# Patient Record
Sex: Female | Born: 1980 | Race: White | Hispanic: No
Health system: Southern US, Community
[De-identification: ages and names within clinical notes are randomized; demographics above are authoritative.]

---

## 2019-11-15 DIAGNOSIS — I361 Nonrheumatic tricuspid (valve) insufficiency: Secondary | ICD-10-CM | POA: Diagnosis not present

## 2019-11-16 ENCOUNTER — Inpatient Hospital Stay (HOSPITAL_COMMUNITY): Payer: Medicaid Other

## 2019-11-16 ENCOUNTER — Inpatient Hospital Stay (HOSPITAL_COMMUNITY)
Admission: AD | Admit: 2019-11-16 | Discharge: 2019-12-11 | DRG: 870 | Disposition: E | Payer: Medicaid Other | Source: Other Acute Inpatient Hospital | Attending: Pulmonary Disease | Admitting: Pulmonary Disease

## 2019-11-16 DIAGNOSIS — R6521 Severe sepsis with septic shock: Secondary | ICD-10-CM | POA: Diagnosis present

## 2019-11-16 DIAGNOSIS — N179 Acute kidney failure, unspecified: Secondary | ICD-10-CM | POA: Diagnosis present

## 2019-11-16 DIAGNOSIS — A419 Sepsis, unspecified organism: Secondary | ICD-10-CM | POA: Diagnosis not present

## 2019-11-16 DIAGNOSIS — R7881 Bacteremia: Secondary | ICD-10-CM | POA: Diagnosis not present

## 2019-11-16 DIAGNOSIS — E872 Acidosis: Secondary | ICD-10-CM | POA: Diagnosis not present

## 2019-11-16 DIAGNOSIS — Z9911 Dependence on respirator [ventilator] status: Secondary | ICD-10-CM

## 2019-11-16 DIAGNOSIS — J9602 Acute respiratory failure with hypercapnia: Secondary | ICD-10-CM | POA: Diagnosis present

## 2019-11-16 DIAGNOSIS — Z9884 Bariatric surgery status: Secondary | ICD-10-CM | POA: Diagnosis not present

## 2019-11-16 DIAGNOSIS — E875 Hyperkalemia: Secondary | ICD-10-CM | POA: Diagnosis present

## 2019-11-16 DIAGNOSIS — Z978 Presence of other specified devices: Secondary | ICD-10-CM

## 2019-11-16 DIAGNOSIS — Z791 Long term (current) use of non-steroidal anti-inflammatories (NSAID): Secondary | ICD-10-CM

## 2019-11-16 DIAGNOSIS — F1190 Opioid use, unspecified, uncomplicated: Secondary | ICD-10-CM | POA: Diagnosis present

## 2019-11-16 DIAGNOSIS — D6959 Other secondary thrombocytopenia: Secondary | ICD-10-CM | POA: Diagnosis present

## 2019-11-16 DIAGNOSIS — L8990 Pressure ulcer of unspecified site, unspecified stage: Secondary | ICD-10-CM | POA: Diagnosis present

## 2019-11-16 DIAGNOSIS — R0602 Shortness of breath: Secondary | ICD-10-CM | POA: Diagnosis present

## 2019-11-16 DIAGNOSIS — G43909 Migraine, unspecified, not intractable, without status migrainosus: Secondary | ICD-10-CM | POA: Diagnosis not present

## 2019-11-16 DIAGNOSIS — Z515 Encounter for palliative care: Secondary | ICD-10-CM | POA: Diagnosis not present

## 2019-11-16 DIAGNOSIS — N170 Acute kidney failure with tubular necrosis: Secondary | ICD-10-CM | POA: Diagnosis not present

## 2019-11-16 DIAGNOSIS — R34 Anuria and oliguria: Secondary | ICD-10-CM | POA: Diagnosis not present

## 2019-11-16 DIAGNOSIS — R0902 Hypoxemia: Secondary | ICD-10-CM

## 2019-11-16 DIAGNOSIS — Z66 Do not resuscitate: Secondary | ICD-10-CM | POA: Diagnosis not present

## 2019-11-16 DIAGNOSIS — J13 Pneumonia due to Streptococcus pneumoniae: Secondary | ICD-10-CM | POA: Diagnosis present

## 2019-11-16 DIAGNOSIS — Z87891 Personal history of nicotine dependence: Secondary | ICD-10-CM

## 2019-11-16 DIAGNOSIS — L89151 Pressure ulcer of sacral region, stage 1: Secondary | ICD-10-CM | POA: Diagnosis present

## 2019-11-16 DIAGNOSIS — F319 Bipolar disorder, unspecified: Secondary | ICD-10-CM | POA: Diagnosis present

## 2019-11-16 DIAGNOSIS — D6489 Other specified anemias: Secondary | ICD-10-CM | POA: Diagnosis present

## 2019-11-16 DIAGNOSIS — E8729 Other acidosis: Secondary | ICD-10-CM | POA: Diagnosis present

## 2019-11-16 DIAGNOSIS — E876 Hypokalemia: Secondary | ICD-10-CM | POA: Diagnosis not present

## 2019-11-16 DIAGNOSIS — J80 Acute respiratory distress syndrome: Secondary | ICD-10-CM

## 2019-11-16 DIAGNOSIS — E874 Mixed disorder of acid-base balance: Secondary | ICD-10-CM | POA: Diagnosis not present

## 2019-11-16 DIAGNOSIS — Z91018 Allergy to other foods: Secondary | ICD-10-CM

## 2019-11-16 DIAGNOSIS — Z09 Encounter for follow-up examination after completed treatment for conditions other than malignant neoplasm: Secondary | ICD-10-CM

## 2019-11-16 DIAGNOSIS — Z91013 Allergy to seafood: Secondary | ICD-10-CM | POA: Diagnosis not present

## 2019-11-16 DIAGNOSIS — A403 Sepsis due to Streptococcus pneumoniae: Principal | ICD-10-CM | POA: Diagnosis present

## 2019-11-16 LAB — MRSA PCR SCREENING: MRSA by PCR: NEGATIVE

## 2019-11-16 LAB — POCT I-STAT 7, (LYTES, BLD GAS, ICA,H+H)
Acid-base deficit: 12 mmol/L — ABNORMAL HIGH (ref 0.0–2.0)
Acid-base deficit: 12 mmol/L — ABNORMAL HIGH (ref 0.0–2.0)
Acid-base deficit: 10 mmol/L — ABNORMAL HIGH (ref 0.0–2.0)
Bicarbonate: 18.4 mmol/L — ABNORMAL LOW (ref 20.0–28.0)
Bicarbonate: 19.2 mmol/L — ABNORMAL LOW (ref 20.0–28.0)
Bicarbonate: 19.9 mmol/L — ABNORMAL LOW (ref 20.0–28.0)
Calcium, Ion: 1.19 mmol/L (ref 1.15–1.40)
Calcium, Ion: 1.2 mmol/L (ref 1.15–1.40)
Calcium, Ion: 1.16 mmol/L (ref 1.15–1.40)
HCT: 36 % (ref 36.0–46.0)
HCT: 38 % (ref 36.0–46.0)
HCT: 38 % (ref 36.0–46.0)
Hemoglobin: 12.2 g/dL (ref 12.0–15.0)
Hemoglobin: 12.9 g/dL (ref 12.0–15.0)
Hemoglobin: 12.9 g/dL (ref 12.0–15.0)
O2 Saturation: 92 %
O2 Saturation: 94 %
O2 Saturation: 93 %
Patient temperature: 98.9
Patient temperature: 98.9
Patient temperature: 99.9
Potassium: 4 mmol/L (ref 3.5–5.1)
Potassium: 4.3 mmol/L (ref 3.5–5.1)
Potassium: 4 mmol/L (ref 3.5–5.1)
Sodium: 138 mmol/L (ref 135–145)
Sodium: 140 mmol/L (ref 135–145)
Sodium: 139 mmol/L (ref 135–145)
TCO2: 20 mmol/L — ABNORMAL LOW (ref 22–32)
TCO2: 21 mmol/L — ABNORMAL LOW (ref 22–32)
TCO2: 22 mmol/L (ref 22–32)
pCO2 arterial: 66.1 mmHg (ref 32.0–48.0)
pCO2 arterial: 67.1 mmHg (ref 32.0–48.0)
pCO2 arterial: 64.5 mmHg — ABNORMAL HIGH (ref 32.0–48.0)
pH, Arterial: 7.054 — CL (ref 7.350–7.450)
pH, Arterial: 7.065 — CL (ref 7.350–7.450)
pH, Arterial: 7.102 — CL (ref 7.350–7.450)
pO2, Arterial: 93 mmHg (ref 83.0–108.0)
pO2, Arterial: 101 mmHg (ref 83.0–108.0)
pO2, Arterial: 97 mmHg (ref 83.0–108.0)

## 2019-11-16 LAB — CBC WITH DIFFERENTIAL/PLATELET
Abs Immature Granulocytes: 0.91 10*3/uL — ABNORMAL HIGH (ref 0.00–0.07)
Basophils Absolute: 0 10*3/uL (ref 0.0–0.1)
Basophils Relative: 0 %
Eosinophils Absolute: 0 10*3/uL (ref 0.0–0.5)
Eosinophils Relative: 0 %
HCT: 37.4 % (ref 36.0–46.0)
Hemoglobin: 11.8 g/dL — ABNORMAL LOW (ref 12.0–15.0)
Immature Granulocytes: 5 %
Lymphocytes Relative: 3 %
Lymphs Abs: 0.5 10*3/uL — ABNORMAL LOW (ref 0.7–4.0)
MCH: 28.4 pg (ref 26.0–34.0)
MCHC: 31.6 g/dL (ref 30.0–36.0)
MCV: 89.9 fL (ref 80.0–100.0)
Monocytes Absolute: 0.5 10*3/uL (ref 0.1–1.0)
Monocytes Relative: 3 %
Neutro Abs: 16.2 10*3/uL — ABNORMAL HIGH (ref 1.7–7.7)
Neutrophils Relative %: 89 %
Platelets: 99 10*3/uL — ABNORMAL LOW (ref 150–400)
RBC: 4.16 MIL/uL (ref 3.87–5.11)
RDW: 17.2 % — ABNORMAL HIGH (ref 11.5–15.5)
WBC: 18.2 10*3/uL — ABNORMAL HIGH (ref 4.0–10.5)
nRBC: 0.2 % (ref 0.0–0.2)

## 2019-11-16 LAB — HIV ANTIBODY (ROUTINE TESTING W REFLEX): HIV Screen 4th Generation wRfx: NONREACTIVE

## 2019-11-16 LAB — BASIC METABOLIC PANEL
Anion gap: 11 (ref 5–15)
BUN: 87 mg/dL — ABNORMAL HIGH (ref 6–20)
CO2: 19 mmol/L — ABNORMAL LOW (ref 22–32)
Calcium: 8 mg/dL — ABNORMAL LOW (ref 8.9–10.3)
Chloride: 107 mmol/L (ref 98–111)
Creatinine, Ser: 4.04 mg/dL — ABNORMAL HIGH (ref 0.44–1.00)
GFR calc Af Amer: 15 mL/min — ABNORMAL LOW (ref >60)
GFR calc non Af Amer: 13 mL/min — ABNORMAL LOW (ref >60)
Glucose, Bld: 127 mg/dL — ABNORMAL HIGH (ref 70–99)
Potassium: 4 mmol/L (ref 3.5–5.1)
Sodium: 137 mmol/L (ref 135–145)

## 2019-11-16 LAB — GLUCOSE, CAPILLARY
Glucose-Capillary: 113 mg/dL — ABNORMAL HIGH (ref 70–99)
Glucose-Capillary: 114 mg/dL — ABNORMAL HIGH (ref 70–99)

## 2019-11-16 LAB — HEPATIC FUNCTION PANEL
ALT: 53 U/L — ABNORMAL HIGH (ref 0–44)
AST: 75 U/L — ABNORMAL HIGH (ref 15–41)
Albumin: 1.9 g/dL — ABNORMAL LOW (ref 3.5–5.0)
Alkaline Phosphatase: 69 U/L (ref 38–126)
Bilirubin, Direct: 0.5 mg/dL — ABNORMAL HIGH (ref 0.0–0.2)
Indirect Bilirubin: 0.8 mg/dL (ref 0.3–0.9)
Total Bilirubin: 1.3 mg/dL — ABNORMAL HIGH (ref 0.3–1.2)
Total Protein: 5.2 g/dL — ABNORMAL LOW (ref 6.5–8.1)

## 2019-11-16 LAB — MAGNESIUM: Magnesium: 2.5 mg/dL — ABNORMAL HIGH (ref 1.7–2.4)

## 2019-11-16 LAB — PROTIME-INR
INR: 1.3 — ABNORMAL HIGH (ref 0.8–1.2)
Prothrombin Time: 15.9 seconds — ABNORMAL HIGH (ref 11.4–15.2)

## 2019-11-16 LAB — HEMOGLOBIN A1C
Hgb A1c MFr Bld: 5.7 % — ABNORMAL HIGH (ref 4.8–5.6)
Mean Plasma Glucose: 116.89 mg/dL

## 2019-11-16 LAB — TRIGLYCERIDES: Triglycerides: 194 mg/dL — ABNORMAL HIGH (ref <150)

## 2019-11-16 LAB — LACTIC ACID, PLASMA: Lactic Acid, Venous: 1.1 mmol/L (ref 0.5–1.9)

## 2019-11-16 MED ORDER — CHLORHEXIDINE GLUCONATE 0.12% ORAL RINSE (MEDLINE KIT)
15.0000 mL | Freq: Two times a day (BID) | OROMUCOSAL | Status: DC
  Administered 2019-11-16 – 2019-11-23 (×15): 15 mL via OROMUCOSAL

## 2019-11-16 MED ORDER — SODIUM BICARBONATE 8.4 % IV SOLN
100.0000 meq | Freq: Once | INTRAVENOUS | Status: AC
  Administered 2019-11-16: 100 meq via INTRAVENOUS
  Filled 2019-11-16: qty 100

## 2019-11-16 MED ORDER — NOREPINEPHRINE 4 MG/250ML-% IV SOLN
0.0000 ug/min | INTRAVENOUS | Status: DC
  Administered 2019-11-16: 5 ug/min via INTRAVENOUS
  Filled 2019-11-16: qty 250

## 2019-11-16 MED ORDER — SODIUM CHLORIDE 0.9 % IV SOLN: INTRAVENOUS | Status: DC | PRN

## 2019-11-16 MED ORDER — HEPARIN SODIUM (PORCINE) 5000 UNIT/ML IJ SOLN
5000.0000 [IU] | Freq: Three times a day (TID) | INTRAMUSCULAR | Status: DC
  Administered 2019-11-16 – 2019-11-23 (×21): 5000 [IU] via SUBCUTANEOUS
  Filled 2019-11-16 (×19): qty 1

## 2019-11-16 MED ORDER — ORAL CARE MOUTH RINSE
15.0000 mL | OROMUCOSAL | Status: DC
  Administered 2019-11-16 – 2019-11-23 (×68): 15 mL via OROMUCOSAL

## 2019-11-16 MED ORDER — INSULIN ASPART 100 UNIT/ML ~~LOC~~ SOLN
0.0000 [IU] | SUBCUTANEOUS | Status: DC
  Administered 2019-11-17 – 2019-11-18 (×4): 1 [IU] via SUBCUTANEOUS
  Administered 2019-11-18: 2 [IU] via SUBCUTANEOUS
  Administered 2019-11-18: 1 [IU] via SUBCUTANEOUS
  Administered 2019-11-18: 2 [IU] via SUBCUTANEOUS
  Administered 2019-11-19 – 2019-11-20 (×4): 1 [IU] via SUBCUTANEOUS
  Administered 2019-11-20: 2 [IU] via SUBCUTANEOUS
  Administered 2019-11-20 (×3): 1 [IU] via SUBCUTANEOUS
  Administered 2019-11-20: 2 [IU] via SUBCUTANEOUS
  Administered 2019-11-21 – 2019-11-23 (×8): 1 [IU] via SUBCUTANEOUS
  Administered 2019-11-23: 2 [IU] via SUBCUTANEOUS
  Administered 2019-11-23: 1 [IU] via SUBCUTANEOUS
  Administered 2019-11-23: 2 [IU] via SUBCUTANEOUS

## 2019-11-16 MED ORDER — PROPOFOL 1000 MG/100ML IV EMUL
5.0000 ug/kg/min | INTRAVENOUS | Status: DC
  Administered 2019-11-16: 50 ug/kg/min via INTRAVENOUS
  Administered 2019-11-16: 25 ug/kg/min via INTRAVENOUS
  Administered 2019-11-17 (×5): 50 ug/kg/min via INTRAVENOUS
  Administered 2019-11-17: 40 ug/kg/min via INTRAVENOUS
  Administered 2019-11-18: 80 ug/kg/min via INTRAVENOUS
  Filled 2019-11-16 (×13): qty 100

## 2019-11-16 MED ORDER — NOREPINEPHRINE 16 MG/250ML-% IV SOLN
0.0000 ug/min | INTRAVENOUS | Status: DC
  Administered 2019-11-16: 5 ug/min via INTRAVENOUS
  Administered 2019-11-17: 11 ug/min via INTRAVENOUS
  Administered 2019-11-18: 10 ug/min via INTRAVENOUS
  Administered 2019-11-20: 4 ug/min via INTRAVENOUS
  Administered 2019-11-22: 13 ug/min via INTRAVENOUS
  Administered 2019-11-23: 20 ug/min via INTRAVENOUS
  Filled 2019-11-16 (×6): qty 250

## 2019-11-16 MED ORDER — VECURONIUM BOLUS VIA INFUSION
10.0000 mg | Freq: Once | INTRAVENOUS | Status: DC
  Filled 2019-11-16: qty 10

## 2019-11-16 MED ORDER — FENTANYL 2500MCG IN NS 250ML (10MCG/ML) PREMIX INFUSION
0.0000 ug/h | INTRAVENOUS | Status: DC
  Administered 2019-11-16 – 2019-11-23 (×15): 200 ug/h via INTRAVENOUS
  Filled 2019-11-16 (×16): qty 250

## 2019-11-16 MED ORDER — VECURONIUM BROMIDE 10 MG IV SOLR
10.0000 mg | Freq: Once | INTRAVENOUS | Status: AC
  Administered 2019-11-16: 10 mg via INTRAVENOUS
  Filled 2019-11-16: qty 10

## 2019-11-16 MED ORDER — LACTATED RINGERS IV BOLUS (SEPSIS)
1000.0000 mL | Freq: Once | INTRAVENOUS | Status: AC
  Administered 2019-11-16: 1000 mL via INTRAVENOUS

## 2019-11-16 MED ORDER — HYDROCORTISONE NA SUCCINATE PF 100 MG IJ SOLR
50.0000 mg | Freq: Four times a day (QID) | INTRAMUSCULAR | Status: DC
  Administered 2019-11-16 – 2019-11-18 (×7): 50 mg via INTRAVENOUS
  Filled 2019-11-16 (×7): qty 2

## 2019-11-16 MED ORDER — SODIUM CHLORIDE 0.9 % IV SOLN: INTRAVENOUS | Status: DC

## 2019-11-16 MED ORDER — VANCOMYCIN HCL IN DEXTROSE 1-5 GM/200ML-% IV SOLN
1000.0000 mg | INTRAVENOUS | Status: DC
  Administered 2019-11-16: 1000 mg via INTRAVENOUS
  Filled 2019-11-16: qty 200

## 2019-11-16 MED ORDER — SODIUM CHLORIDE 0.9 % IV SOLN
1.0000 g | Freq: Two times a day (BID) | INTRAVENOUS | Status: DC
  Administered 2019-11-16: 1 g via INTRAVENOUS
  Filled 2019-11-16 (×3): qty 1

## 2019-11-16 NOTE — Progress Notes (Signed)
ABG obtained after patient arrived from [place] via [employer].  Sample obtained on settings of VT: 540, RR: 15, PEEP: 8, FIO2: 80%.  Results given to MD.  Instructed to place patient on 6cc/kg VT which is 410 and increase RR to 25.  Currently patient getting lines placed.  Will obtain follow up sample once MD finished.  Will continue to monitor.    Ref. Range 29-Nov-2019 17:06  Sample type Unknown ARTERIAL  pH, Arterial Latest Ref Range: 7.35 - 7.45  7.054 (LL)  pCO2 arterial Latest Ref Range: 32 - 48 mmHg 66.1 (HH)  pO2, Arterial Latest Ref Range: 83 - 108 mmHg 93  TCO2 Latest Ref Range: 22 - 32 mmol/L 20 (L)  Acid-base deficit Latest Ref Range: 0.0 - 2.0 mmol/L 12.0 (H)  Bicarbonate Latest Ref Range: 20.0 - 28.0 mmol/L 18.4 (L)  O2 Saturation Latest Units: % 92.0  Patient temperature Unknown 98.9 F  Collection site Unknown Radial

## 2019-11-16 NOTE — Procedures (Signed)
Central Venous Catheter Insertion Procedure Note  Gala Padovano  [id number]  July 17, 1980  Date:2019/11/27  Time:6:52 PM   Provider Performing:Delayni Streed   Procedure: Insertion of Non-tunneled Central Venous [phone]) with US guidance (62831)   Indication(s) Medication administration  Consent Risks of the procedure as well as the alternatives and risks of each were explained to the patient and/or caregiver.  Consent for the procedure was obtained and is signed in the bedside chart  Anesthesia Topical only with 1% lidocaine   Timeout Verified patient identification, verified procedure, site/side was marked, verified correct patient position, special equipment/implants available, medications/allergies/relevant history reviewed, required imaging and test results available.  Sterile Technique Maximal sterile technique including full sterile barrier drape, hand hygiene, sterile gown, sterile gloves, mask, hair covering, sterile ultrasound probe cover (if used).  Procedure Description Area of catheter insertion was cleaned with chlorhexidine and draped in sterile fashion.  With real-time ultrasound guidance a central venous catheter was placed into the right internal jugular vein. Nonpulsatile blood flow and easy flushing noted in all ports.  The catheter was sutured in place and sterile dressing applied.  Complications/Tolerance None; patient tolerated the procedure well. Chest X-ray is ordered to verify placement for internal jugular or subclavian cannulation.   Chest x-ray is not ordered for femoral cannulation.  EBL Minimal  Specimen(s) None

## 2019-11-16 NOTE — Progress Notes (Signed)
CRITICAL VALUE ALERT  Critical Value:  ABGs   Date & Time Notied:  11/16/18 2334  Provider Notified: E-link RN  Orders Received/Actions taken: RN will notify MD. Waiting for orders

## 2019-11-16 NOTE — Progress Notes (Signed)
eLink Physician-Brief Progress Note Patient Name: Rachael Harvey DOB: Feb 13, 1981 MRN: [id number]   Date of Service  Nov 18, 2019  HPI/Events of Note  Patient needs an ABG stat and a.m. Troponin  eICU Interventions  Orders entered.        Rachael Harvey 11-18-2019, 11:12 PM

## 2019-11-16 NOTE — H&P (Signed)
NAME:  Rachael Harvey, MRN:  [id number], DOB:  02-21-1981, LOS: 0 ADMISSION DATE:  2019/11/28,   CHIEF COMPLAINT:  Shortness of breath  Brief History   39 year old female was transferred from outside hospital with acute hypoxic/hypercapnic respiratory failure, ARDS and acute kidney injury  History of present illness   39 year old female with history of anxiety and depression who was transferred from [facility] for worsening respiratory failure. Patient is intubated so most of the history is taken from patient's chart, as per chart patient was not feeling well for last few days with a cough, shortness of breath and fever.  When EMS was called patient was noted to be hypoxic into the 60s, she was put on high flow and was transferred to [facility] emergency department, where she was put on BiPAP but she continued to remain hypoxic so decision was made to intubate, she was admitted in the hospital with acute hypoxic respiratory failure due to multifocal pneumonia. Over the last 2 days she has been getting IV antibiotics, but her condition continued to deteriorate with hypoxia, hypercapnia and acute metabolic acidosis associated with acute kidney injury so she was transferred to [facility] for further management.  Past Medical History  Anxiety Depression Migraine Questionable history of acute leukemia per patient's mother  Significant Hospital Events     Consults:  None  Procedures:  CVL 7/7 >> Arterial line >>  Significant Diagnostic Tests:    Micro Data:  7/5 Blood culture strep pneumo 7/7 >>  Antimicrobials:  7/7 Vancomycin >> 7/7 Cefepime>>  Interim history/subjective:  Patient's blood gas came back with a pH of 7.0 PCO2 in the 60s with PO2 93 on 80% FiO2.  Her P to F ratio is less than 150  Objective   Blood pressure (!) 103/53, pulse 100, resp. rate 12, height 5\' 10"  (1.778 m), weight 97 kg, SpO2 93 %.    Vent Mode: PRVC FiO2 (%):  [80 %] 80 % Set Rate:   [15 bmp] 15 bmp Vt Set:  [540 mL] 540 mL PEEP:  [8 cmH20] 8 cmH20  No intake or output data in the 24 hours ending 11-28-2019 1740 Filed Weights   28-Nov-2019 1608  Weight: 97 kg    Examination: General: Young female looks acutely ill, orally intubated on mechanical ventilation HENT: Mucous membranes are moist, atraumatic, normocephalic Lungs: Bilateral crackles are over Cardiovascular: Regular rate and rhythm, no murmur appreciated Abdomen: Soft, nontender, nondistended, bowel sounds present Extremities: Moves all spontaneously Neuro: Sedated, eyes closed, not following commands Skin: No rash  Resolved Hospital Problem list     Assessment & Plan:  Acute hypoxic/hypercapnic respiratory failure due to multifocal pneumonia Severe ARDS P:  Patient is intubated, on mechanical ventilation Continue high PEEP strategy for arts net protocol Her P to F ratio less than 150 I adjusted her ventilatory setting to improve hypoxemia Repeat ABGs Continue IV antibiotics with cefepime and vancomycin Follow-up blood cultures  Septic shock with endorgan damage due to pneumococcal pneumonia and pneumococcal bacteremia P: Patient blood cultures were positive for pneumococcal pneumonia at outside hospital She has endorgan damage with AKI and respiratory failure I Started Levophed to maintain perfusion to vital organs with map goal of 65 Continue antibiotics with cefepime and vancomycin, to treat the resistant strain of pneumococcus  Acute kidney injury P: Patient presented with serum creatinine of 2, with low urine output likely due to septic shock She received 1 L IV fluid We will monitor serum creatinine closely, if her  urine output did not pick up and her electrolyte started getting the range, will ask renal consult for possible hemodialysis  Mixed acute respiratory/metabolic acidosis P: I adjusted the ventilatory setting with increasing respiratory rate to help clear respiratory acidosis We  will repeat ABGs  Leukopenia/thrombocytopenia P: Likely related to sepsis and septic shock, will continue to monitor   Best practice:  Diet: NPO Pain/Anxiety/Delirium protocol (if indicated): N/A VAP protocol (if indicated): Y DVT prophylaxis: Subcu heparin GI prophylaxis: Famotidine Glucose control: Sliding scale insulin Mobility: Bedrest Code Status: Full code Family Communication: Updated patient's mother who is a healthcare proxy Disposition: ICU  Labs   CBC: Recent Labs  Lab November 30, 2019 1706  HGB 12.2  HCT 36.0    Basic Metabolic Panel: Recent Labs  Lab 30-Nov-2019 1706  NA 138  K 4.0   GFR: CrCl cannot be calculated (No successful lab value found.). No results for input(s): PROCALCITON, WBC, LATICACIDVEN in the last 168 hours.  Liver Function Tests: No results for input(s): AST, ALT, ALKPHOS, BILITOT, PROT, ALBUMIN in the last 168 hours. No results for input(s): LIPASE, AMYLASE in the last 168 hours. No results for input(s): AMMONIA in the last 168 hours.  ABG    Component Value Date/Time   PHART 7.054 (LL) 2019-11-30 1706   PCO2ART 66.1 (HH) 2019-11-30 1706   PO2ART 93 2019/11/30 1706   HCO3 18.4 (L) Nov 30, 2019 1706   TCO2 20 (L) November 30, 2019 1706   ACIDBASEDEF 12.0 (H) 11/30/2019 1706   O2SAT 92.0 November 30, 2019 1706     Coagulation Profile: No results for input(s): INR, PROTIME in the last 168 hours.  Cardiac Enzymes: No results for input(s): CKTOTAL, CKMB, CKMBINDEX, TROPONINI in the last 168 hours.  HbA1C: No results found for: HGBA1C  CBG: No results for input(s): GLUCAP in the last 168 hours.  Review of Systems:   Unable to obtain as patient is intubated and sedated  Past Medical History  As above  Surgical History   Hysterectomy  Gastric bypass surgery  Social History     Ex-smoker Family History   Her family history is not on file.   Allergies Peach leading to hives Shellfish leading to hives  Home Medications  Prior to  Admission medications   Not on File     Critical care time: 41    Performed by: Cheri Fowler   Critical care time was exclusive of separately billable procedures and treating other patients.   Critical care was necessary to treat or prevent imminent or life-threatening deterioration.   Critical care was time spent personally by me on the following activities: development of treatment plan with patient and/or surrogate as well as nursing, discussions with consultants, evaluation of patient's response to treatment, examination of patient, obtaining history from patient or surrogate, ordering and performing treatments and interventions, ordering and review of laboratory studies, ordering and review of radiographic studies, pulse oximetry and re-evaluation of patient's condition.   Cheri Fowler MD Critical care physician [facility]  Pager: [phone] Mobile: [phone]

## 2019-11-16 NOTE — Procedures (Signed)
Central Venous Catheter Insertion Procedure Note  Rachael Harvey  [id number]  12-14-1980  Date:12/11/19  Time:6:51 PM    Provider Performing: Cheri Fowler    Procedure: Insertion of Arterial Line (35573) with US guidance (22025)   Indication(s) Blood pressure monitoring and/or need for frequent ABGs  Consent Risks of the procedure as well as the alternatives and risks of each were explained to the patient and/or caregiver.  Consent for the procedure was obtained and is signed in the bedside chart  Anesthesia None   Time Out Verified patient identification, verified procedure, site/side was marked, verified correct patient position, special equipment/implants available, medications/allergies/relevant history reviewed, required imaging and test results available.   Sterile Technique Maximal sterile technique including full sterile barrier drape, hand hygiene, sterile gown, sterile gloves, mask, hair covering, sterile ultrasound probe cover (if used).   Procedure Description Area of catheter insertion was cleaned with chlorhexidine and draped in sterile fashion. With real-time ultrasound guidance an arterial catheter was placed into the right Axillary artery.  Appropriate arterial tracings confirmed on monitor.     Complications/Tolerance None; patient tolerated the procedure well.   EBL Minimal   Specimen(s) None

## 2019-11-17 DIAGNOSIS — Z9911 Dependence on respirator [ventilator] status: Secondary | ICD-10-CM

## 2019-11-17 DIAGNOSIS — B953 Streptococcus pneumoniae as the cause of diseases classified elsewhere: Secondary | ICD-10-CM | POA: Diagnosis present

## 2019-11-17 DIAGNOSIS — J9602 Acute respiratory failure with hypercapnia: Secondary | ICD-10-CM | POA: Diagnosis present

## 2019-11-17 DIAGNOSIS — E8729 Other acidosis: Secondary | ICD-10-CM | POA: Diagnosis present

## 2019-11-17 LAB — GLUCOSE, CAPILLARY
Glucose-Capillary: 155 mg/dL — ABNORMAL HIGH (ref 70–99)
Glucose-Capillary: 116 mg/dL — ABNORMAL HIGH (ref 70–99)
Glucose-Capillary: 113 mg/dL — ABNORMAL HIGH (ref 70–99)
Glucose-Capillary: 118 mg/dL — ABNORMAL HIGH (ref 70–99)
Glucose-Capillary: 128 mg/dL — ABNORMAL HIGH (ref 70–99)
Glucose-Capillary: 146 mg/dL — ABNORMAL HIGH (ref 70–99)
Glucose-Capillary: 169 mg/dL — ABNORMAL HIGH (ref 70–99)

## 2019-11-17 LAB — CORTISOL-AM, BLOOD: Cortisol - AM: 75.1 ug/dL — ABNORMAL HIGH (ref 6.7–22.6)

## 2019-11-17 LAB — POCT I-STAT 7, (LYTES, BLD GAS, ICA,H+H)
Acid-base deficit: 9 mmol/L — ABNORMAL HIGH (ref 0.0–2.0)
Acid-base deficit: 8 mmol/L — ABNORMAL HIGH (ref 0.0–2.0)
Bicarbonate: 19.1 mmol/L — ABNORMAL LOW (ref 20.0–28.0)
Bicarbonate: 19 mmol/L — ABNORMAL LOW (ref 20.0–28.0)
Calcium, Ion: 1.14 mmol/L — ABNORMAL LOW (ref 1.15–1.40)
Calcium, Ion: 1.14 mmol/L — ABNORMAL LOW (ref 1.15–1.40)
HCT: 36 % (ref 36.0–46.0)
HCT: 34 % — ABNORMAL LOW (ref 36.0–46.0)
Hemoglobin: 12.2 g/dL (ref 12.0–15.0)
Hemoglobin: 11.6 g/dL — ABNORMAL LOW (ref 12.0–15.0)
O2 Saturation: 99 %
O2 Saturation: 99 %
Patient temperature: 38.1
Patient temperature: 38.1
Potassium: 4 mmol/L (ref 3.5–5.1)
Potassium: 3.6 mmol/L (ref 3.5–5.1)
Sodium: 138 mmol/L (ref 135–145)
Sodium: 139 mmol/L (ref 135–145)
TCO2: 21 mmol/L — ABNORMAL LOW (ref 22–32)
TCO2: 20 mmol/L — ABNORMAL LOW (ref 22–32)
pCO2 arterial: 51.3 mmHg — ABNORMAL HIGH (ref 32.0–48.0)
pCO2 arterial: 48.6 mmHg — ABNORMAL HIGH (ref 32.0–48.0)
pH, Arterial: 7.184 — CL (ref 7.350–7.450)
pH, Arterial: 7.206 — ABNORMAL LOW (ref 7.350–7.450)
pO2, Arterial: 153 mmHg — ABNORMAL HIGH (ref 83.0–108.0)
pO2, Arterial: 178 mmHg — ABNORMAL HIGH (ref 83.0–108.0)

## 2019-11-17 LAB — BASIC METABOLIC PANEL
Anion gap: 14 (ref 5–15)
BUN: 94 mg/dL — ABNORMAL HIGH (ref 6–20)
CO2: 16 mmol/L — ABNORMAL LOW (ref 22–32)
Calcium: 6.3 mg/dL — CL (ref 8.9–10.3)
Chloride: 107 mmol/L (ref 98–111)
Creatinine, Ser: 4.39 mg/dL — ABNORMAL HIGH (ref 0.44–1.00)
GFR calc Af Amer: 14 mL/min — ABNORMAL LOW (ref >60)
GFR calc non Af Amer: 12 mL/min — ABNORMAL LOW (ref >60)
Glucose, Bld: 132 mg/dL — ABNORMAL HIGH (ref 70–99)
Potassium: 5 mmol/L (ref 3.5–5.1)
Sodium: 137 mmol/L (ref 135–145)

## 2019-11-17 LAB — CBC
HCT: 35.9 % — ABNORMAL LOW (ref 36.0–46.0)
Hemoglobin: 11.7 g/dL — ABNORMAL LOW (ref 12.0–15.0)
MCH: 28.9 pg (ref 26.0–34.0)
MCHC: 32.6 g/dL (ref 30.0–36.0)
MCV: 88.6 fL (ref 80.0–100.0)
Platelets: 105 10*3/uL — ABNORMAL LOW (ref 150–400)
RBC: 4.05 MIL/uL (ref 3.87–5.11)
RDW: 17.2 % — ABNORMAL HIGH (ref 11.5–15.5)
WBC: 22.1 10*3/uL — ABNORMAL HIGH (ref 4.0–10.5)
nRBC: 0.1 % (ref 0.0–0.2)

## 2019-11-17 LAB — RENAL FUNCTION PANEL
Albumin: 1.9 g/dL — ABNORMAL LOW (ref 3.5–5.0)
Anion gap: 15 (ref 5–15)
BUN: 104 mg/dL — ABNORMAL HIGH (ref 6–20)
CO2: 15 mmol/L — ABNORMAL LOW (ref 22–32)
Calcium: 8.6 mg/dL — ABNORMAL LOW (ref 8.9–10.3)
Chloride: 109 mmol/L (ref 98–111)
Creatinine, Ser: 4.51 mg/dL — ABNORMAL HIGH (ref 0.44–1.00)
GFR calc Af Amer: 13 mL/min — ABNORMAL LOW (ref >60)
GFR calc non Af Amer: 11 mL/min — ABNORMAL LOW (ref >60)
Glucose, Bld: 134 mg/dL — ABNORMAL HIGH (ref 70–99)
Phosphorus: 6.6 mg/dL — ABNORMAL HIGH (ref 2.5–4.6)
Potassium: 3.8 mmol/L (ref 3.5–5.1)
Sodium: 139 mmol/L (ref 135–145)

## 2019-11-17 LAB — TROPONIN I (HIGH SENSITIVITY): Troponin I (High Sensitivity): 91 ng/L — ABNORMAL HIGH (ref <18)

## 2019-11-17 LAB — LACTIC ACID, PLASMA: Lactic Acid, Venous: 1.3 mmol/L (ref 0.5–1.9)

## 2019-11-17 LAB — PROCALCITONIN: Procalcitonin: 46.19 ng/mL

## 2019-11-17 LAB — MAGNESIUM: Magnesium: 2.6 mg/dL — ABNORMAL HIGH (ref 1.7–2.4)

## 2019-11-17 LAB — PROTIME-INR
INR: 1.3 — ABNORMAL HIGH (ref 0.8–1.2)
Prothrombin Time: 15.6 seconds — ABNORMAL HIGH (ref 11.4–15.2)

## 2019-11-17 MED ORDER — CEFAZOLIN SODIUM-DEXTROSE 2-4 GM/100ML-% IV SOLN
2.0000 g | Freq: Two times a day (BID) | INTRAVENOUS | Status: DC
  Administered 2019-11-17 – 2019-11-22 (×10): 2 g via INTRAVENOUS
  Filled 2019-11-17 (×11): qty 100

## 2019-11-17 MED ORDER — SODIUM CHLORIDE 0.9% FLUSH
10.0000 mL | Freq: Two times a day (BID) | INTRAVENOUS | Status: DC
  Administered 2019-11-17 – 2019-11-23 (×13): 10 mL

## 2019-11-17 MED ORDER — VANCOMYCIN VARIABLE DOSE PER UNSTABLE RENAL FUNCTION (PHARMACIST DOSING): Status: DC

## 2019-11-17 MED ORDER — FENTANYL CITRATE (PF) 100 MCG/2ML IJ SOLN
50.0000 ug | INTRAMUSCULAR | Status: DC | PRN
  Administered 2019-11-18 – 2019-11-19 (×4): 100 ug via INTRAVENOUS
  Administered 2019-11-20: 50 ug via INTRAVENOUS
  Administered 2019-11-20 – 2019-11-21 (×2): 100 ug via INTRAVENOUS
  Administered 2019-11-22 (×4): 50 ug via INTRAVENOUS

## 2019-11-17 MED ORDER — SODIUM CHLORIDE 0.9 % IV SOLN
2.0000 g | INTRAVENOUS | Status: DC
  Administered 2019-11-17: 2 g via INTRAVENOUS
  Filled 2019-11-17: qty 2

## 2019-11-17 MED ORDER — PROSOURCE TF PO LIQD
45.0000 mL | Freq: Two times a day (BID) | ORAL | Status: DC
  Administered 2019-11-17 – 2019-11-21 (×9): 45 mL
  Filled 2019-11-17 (×10): qty 45

## 2019-11-17 MED ORDER — PANTOPRAZOLE SODIUM 40 MG IV SOLR
40.0000 mg | INTRAVENOUS | Status: DC
  Administered 2019-11-17 – 2019-11-23 (×7): 40 mg via INTRAVENOUS
  Filled 2019-11-17 (×7): qty 40

## 2019-11-17 MED ORDER — CEFAZOLIN SODIUM-DEXTROSE 2-4 GM/100ML-% IV SOLN
2.0000 g | Freq: Two times a day (BID) | INTRAVENOUS | Status: DC
  Filled 2019-11-17: qty 100

## 2019-11-17 MED ORDER — CHLORHEXIDINE GLUCONATE CLOTH 2 % EX PADS
6.0000 | MEDICATED_PAD | Freq: Every day | CUTANEOUS | Status: DC
  Administered 2019-11-17 – 2019-11-22 (×7): 6 via TOPICAL

## 2019-11-17 MED ORDER — VITAL HIGH PROTEIN PO LIQD
1000.0000 mL | ORAL | Status: DC
  Administered 2019-11-17 – 2019-11-19 (×3): 1000 mL

## 2019-11-17 MED ORDER — DOCUSATE SODIUM 50 MG/5ML PO LIQD
100.0000 mg | Freq: Two times a day (BID) | ORAL | Status: DC
  Administered 2019-11-17 – 2019-11-23 (×13): 100 mg
  Filled 2019-11-17 (×13): qty 10

## 2019-11-17 MED ORDER — POLYETHYLENE GLYCOL 3350 17 G PO PACK
17.0000 g | PACK | Freq: Every day | ORAL | Status: DC
  Administered 2019-11-17 – 2019-11-23 (×7): 17 g
  Filled 2019-11-17 (×7): qty 1

## 2019-11-17 MED ORDER — SODIUM CHLORIDE 0.9% FLUSH: 10.0000 mL | INTRAVENOUS | Status: DC | PRN

## 2019-11-17 MED ORDER — STERILE WATER FOR INJECTION IV SOLN
INTRAVENOUS | Status: DC
  Filled 2019-11-17 (×4): qty 850

## 2019-11-17 MED ORDER — ACETAMINOPHEN 160 MG/5ML PO SOLN
650.0000 mg | ORAL | Status: DC | PRN
  Administered 2019-11-17 – 2019-11-20 (×4): 650 mg
  Filled 2019-11-17 (×3): qty 20.3

## 2019-11-17 MED ORDER — LACTATED RINGERS IV SOLN: INTRAVENOUS | Status: DC

## 2019-11-17 MED ORDER — SODIUM CHLORIDE 0.9 % IV SOLN: INTRAVENOUS | Status: DC

## 2019-11-17 MED ORDER — SODIUM BICARBONATE 8.4 % IV SOLN
25.0000 meq | Freq: Once | INTRAVENOUS | Status: AC
  Administered 2019-11-17: 25 meq via INTRAVENOUS
  Filled 2019-11-17: qty 50

## 2019-11-17 MED ORDER — CALCIUM GLUCONATE-NACL 1-0.675 GM/50ML-% IV SOLN
1.0000 g | Freq: Once | INTRAVENOUS | Status: AC
  Administered 2019-11-17: 1000 mg via INTRAVENOUS
  Filled 2019-11-17: qty 50

## 2019-11-17 NOTE — Progress Notes (Signed)
PHARMACY NOTE:  ANTIMICROBIAL RENAL DOSAGE ADJUSTMENT  Current antimicrobial regimen includes a mismatch between antimicrobial dosage and estimated renal function.  As per policy approved by the Pharmacy & Therapeutics and Medical Executive Committees, the antimicrobial dosage will be adjusted accordingly.  Current antimicrobial dosage:  Cefepime 1g IV q12h; vancomycin 1000mg  IV q24h  Indication: Sepsis due to pneumococcal pneumonia and bacteremia  Renal Function: Scr 4.39 with baseline unknown but increased from 4.04 on admission.   Estimated Creatinine Clearance: 21.7 mL/min (A) (by C-G formula based on SCr of 4.39 mg/dL (H)). []      On intermittent HD, scheduled: []      On CRRT    Antimicrobial dosage has been changed to:  Cefepime 2g IV q24h; Vancomycin to random level dosing due to AKI    Thank you for allowing pharmacy to be a part of this patient's care.  , PharmD Clinical Pharmacist  11/17/2019 7:13 AM

## 2019-11-17 NOTE — Progress Notes (Signed)
Blood cultures from [place] show Pan sensitive strep pneumo
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
## 2019-11-17 NOTE — Progress Notes (Signed)
Covid results were faxed over from [place]. A copy of the results placed in the patient's paper chart.

## 2019-11-17 NOTE — Progress Notes (Signed)
eLink Physician-Brief Progress Note Patient Name: Rachael Harvey DOB: 1980/08/10 MRN: [id number]   Date of Service  11/17/2019  HPI/Events of Note  Serum Calcium 6.3 but Ionized Calcium is 1.14  eICU Interventions  Calcium gluconate  1 gm iv bolus.        Mat Stuard U Adewale Pucillo 11/17/2019, 4:20 AM

## 2019-11-17 NOTE — Progress Notes (Signed)
Initial Nutrition Assessment  DOCUMENTATION CODES:   Not applicable  INTERVENTION:   Initiate tube feeding via OG tube: - Vital High Protein @ 55 ml/hr (1320 ml/day) - ProSource Tube Feeding 45 ml BID  Tube feeding regimen provides 1400 kcal, 138 grams of protein, and 1104 ml of H2O.   Tube feeding regimen and current propofol provides 2168 total kcal (97% of needs).  NUTRITION DIAGNOSIS:   Inadequate oral intake related to inability to eat as evidenced by NPO status.  GOAL:   Provide needs based on ASPEN/SCCM guidelines  MONITOR:   Vent status, Labs, Weight trends, TF tolerance, Skin, I & O's  REASON FOR ASSESSMENT:   Ventilator, Consult Enteral/tube feeding initiation and management  ASSESSMENT:   39 year old female who presented on 7/07 from [facility] with acute hypoxic/hypercapnic respiratory failure, ARDS, AKI. PMH of anxiety, depression, gastric bypass surgery. Pt required intubation and was admitted with multifocal pneumonia.   Discussed pt with RN and during ICU rounds. Consult received for tube feeding initiation and management.  Per notes, pt's mother reporting pt uses heroin and also reporting pt has a diagnosis of leukemia and was going to be starting chemotherapy soon.  Per Nephrology, will likely require CRRT in the next 24 hours if no significant improvement.  OGT in place.  Patient is currently intubated on ventilator support MV: 16.1 L/min Temp (24hrs), Avg:100.1 F (37.8 C), Min:99 F (37.2 C), Max:100.8 F (38.2 C) BP (a-line): 110/56 MAP (a-line): 75  Drips: Propofol: 29.1 ml/hr (provides 768 kcal daily from lipid) Fentanyl: 20 ml/hr Levophed: 10.3 ml/hr  Medications reviewed and include: colace, solu-cortef, SSI q 4 hours, protonix, miralax, IV abx  Labs reviewed: BUN 94, creatinine 4.39, ionized calcium 1.14 CBG's: 113-118  NUTRITION - FOCUSED PHYSICAL EXAM:    Most Recent Value  Orbital Region Mild depletion  Upper Arm  Region No depletion  Thoracic and Lumbar Region No depletion  Buccal Region Severe depletion  Temple Region Moderate depletion  Clavicle Bone Region Mild depletion  Clavicle and Acromion Bone Region Mild depletion  Scapular Bone Region Unable to assess  Dorsal Hand No depletion  Patellar Region No depletion  Anterior Thigh Region No depletion  Posterior Calf Region No depletion  Edema (RD Assessment) Moderate  [BUE]  Hair Reviewed  Eyes Unable to assess  Mouth Unable to assess  Skin Reviewed  Nails Reviewed       Diet Order:   Diet Order            Diet NPO time specified  Diet effective now                 EDUCATION NEEDS:   No education needs have been identified at this time  Skin:  Skin Assessment:  Skin Integrity Issues: Stage I: coccyx  Last BM:  no documented BM  Height:   Ht Readings from Last 1 Encounters:  November 22, 2019 5\' 10"  (1.778 m)    Weight:   Wt Readings from Last 1 Encounters:  2019-11-22 97 kg    Ideal Body Weight:  68.2 kg  BMI:  Body mass index is 30.68 kg/m.  Estimated Nutritional Needs:   Kcal:  2225  Protein:  136-150 grams  Fluid:  >/= 2.0 L    2226, MS, RD, LDN Inpatient Clinical Dietitian Please see AMiON for contact information.

## 2019-11-17 NOTE — Progress Notes (Addendum)
NAME:  Rachael Harvey, MRN:  [id number], DOB:  December 08, 1980, LOS: 1 ADMISSION DATE:  2019-12-10,   CHIEF COMPLAINT:  Shortness of breath  Brief History   39 year old female was transferred from outside hospital with acute hypoxic/hypercapnic respiratory failure, ARDS and acute kidney injury  History of present illness   39 year old female with history of anxiety and depression who was transferred from [facility] for worsening respiratory failure. Patient is intubated so most of the history is taken from patient's chart, as per chart patient was not feeling well for last few days with a cough, shortness of breath and fever.  When EMS was called patient was noted to be hypoxic into the 60s, she was put on high flow and was transferred to [facility] emergency department, where she was put on BiPAP but she continued to remain hypoxic so decision was made to intubate, she was admitted in the hospital with acute hypoxic respiratory failure due to multifocal pneumonia. Over the last 2 days she has been getting IV antibiotics, but her condition continued to deteriorate with hypoxia, hypercapnia and acute metabolic acidosis associated with acute kidney injury so she was transferred to [facility] for further management.  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 She notes that patient was initially informed of having leukemia 2y ago. She then  relayed to her mother, 4w ago, that she was going to be having to start some chemo for this and would be losing her hair. Her mother notes that she was seen at [facility] for this. Her mom also notes that she was told she would be needing HD soon.  The mother also notes a history of substance use, primarily heroin. She notes that her daughter has told her of injecting in the past however she is unsure of how she is currently using.   Objective   Blood pressure (!) 100/53, pulse 84, temperature (!) 100.4 F (38 C), resp. rate (!) 35, height 5\' 10"  (1.778 m), weight 97 kg, SpO2 96 %.    Vent Mode: PRVC FiO2 (%):  [70 %-80 %] 70 % Set Rate:  [15 bmp-35 bmp] 35 bmp Vt Set:  [410 mL-540 mL] 480 mL PEEP:  [8 cmH20] 8 cmH20 Plateau Pressure:  [26 cmH20-31 cmH20] 31 cmH20   Intake/Output Summary (Last 24 hours) at 11/17/2019 0708 Last data filed at 11/17/2019 0600 Gross per 24 hour  Intake 1192.58 ml  Output 200 ml  Net 992.58 ml   Filed Weights   12/10/2019 1608  Weight: 97 kg    Examination: General: critically ill appearing female HENT: ETT Lungs: diffuse bilateral crackles  Cardiovascular: RRR. No LE edema. Abdomen: soft. bs active Extremities: warm Neuro: sedated Skin: No rash  Resolved Hospital Problem list     Assessment & Plan:  Acute hypoxic/hypercapnic respiratory failure due to severe ARDS> multifocal pneumonia requiring mechanical ventilation. Respiratory acidosis resolved P/F ratio now >250. Weaning down FiO2 Dyssynchrony with the vent last night--254mcg fentanyl, 01/17/20 propofol. Plan Continue MV with pressure  control per ARDs net protocol Wean as able Continue sedation with fentanyl and propofol for RAAS goal of -1 to -2  Septic shockdue to pneumococcal pneumonia and pneumococcal bacteremia Leukopenia/thrombocytopenia 2/2 above Increased pressor requirement since admission. Currently on of levo. Persistently febrile as well despite being on day #3 of  abx Plan Continue levo. Titrate to MAP>65 De-escalate abx to cefazolin Would also consider TEE given her history of IVDU and continued fevers.  Acute kidney injury. Renal function declining from admission--UOP 200 since admission. Only received 1L IVF since admission here. Unclear what she received at [place] however bedside RN reports she was brought in on lasix drip. Not appearing hypervolemic at this point but will need to monitor closely. Electrolytes stable at this time however K is trending up Discussed with nephrology--suspects she will need RRT however no urgent indication for it at this tiem Metabolic acidosis likely secondary to renal failure. LA down to <2. K P: Continue to monitor renal function and UOP closely Repeat BMP at noon  Substance use disorder. Mother noting pt uses heroin fairly regularly. Currently on fentanyl drip.  **Mother is also noting pt has a diagnosis of leukemia and was going to be starting chemotherapy soon however she is unsure of other details. I will try to reach out to [place] when her mother reports she recieves care, to see if further records could be obtained pertaining to this.   Best practice:  Diet: TF Pain/Anxiety/Delirium protocol (if indicated): N/A VAP protocol (if indicated): Y DVT prophylaxis: Subcu heparin GI prophylaxis: Famotidine Glucose control: Sliding scale insulin Mobility: Bedrest Code Status: Full code Family Communication: mother updated 7/8 Disposition: ICU  Labs   CBC: Recent Labs  Lab 12-05-2019 1940 2019-12-05 2324 11/17/19 0156 11/17/19 0257 11/17/19 0356  WBC 18.2*  --   --  22.1*  --   NEUTROABS 16.2*  --   --   --   --   HGB 11.8* 12.9 12.2 11.7* 11.6*  HCT 37.4 38.0 36.0 35.9* 34.0*  MCV 89.9  --   --  88.6  --   PLT 99*  --   --  105*  --     Basic Metabolic Panel: Recent Labs  Lab 2019-12-05 1940 2019/12/05 2324 11/17/19 0156 11/17/19 0257 11/17/19 0356  NA 137 139 138 137 139  K 4.0  4.0 4.0 5.0 3.6  CL 107  --   --  107  --   CO2 19*  --   --  16*  --   GLUCOSE 127*  --   --  132*  --   BUN 87*  --   --  94*  --   CREATININE 4.04*  --   --  4.39*  --   CALCIUM 8.0*  --   --  6.3*  --   MG 2.5*  --   --   --   --    GFR: Estimated Creatinine Clearance: 21.7 mL/min (A) (by C-G formula based on SCr of 4.39 mg/dL (H)). Recent Labs  Lab Dec 05, 2019 1940 05-Dec-2019 2320 11/17/19 0257  PROCALCITON  --   --  46.19  WBC 18.2*  --  22.1*  LATICACIDVEN 1.1 1.3  --     Liver Function Tests: Recent Labs  Lab 12/05/19 1940  AST 75*  ALT 53*  ALKPHOS 69  BILITOT 1.3*  PROT 5.2*  ALBUMIN 1.9*   No results for input(s): LIPASE, AMYLASE in the last 168 hours. No results for  input(s): AMMONIA in the last 168 hours.  ABG    Component Value Date/Time   PHART 7.206 (L) 11/17/2019 0356   PCO2ART 48.6 (H) 11/17/2019 0356   PO2ART 178 (H) 11/17/2019 0356   HCO3 19.0 (L) 11/17/2019 0356   TCO2 20 (L) 11/17/2019 0356   ACIDBASEDEF 8.0 (H) 11/17/2019 0356   O2SAT 99.0 11/17/2019 0356     Coagulation Profile: Recent Labs  Lab 11-21-19 1940 11/17/19 0257  INR 1.3* 1.3*    Cardiac Enzymes: No results for input(s): CKTOTAL, CKMB, CKMBINDEX, TROPONINI in the last 168 hours.  HbA1C: Hgb A1c MFr Bld  Date/Time Value Ref Range Status  11-21-2019 07:40 PM 5.7 (H) 4.8 - 5.6 % Final    Comment:    (NOTE) Pre diabetes:          5.7%-6.4%  Diabetes:              >6.4%  Glycemic control for   <7.0% adults with diabetes     CBG: Recent Labs  Lab 11-21-2019 1923 November 21, 2019 2319 11/17/19 0317  GLUCAP 113* 114* 116*    Elige Radon, MD Internal Medicine Resident PGY-2 11/17/19 9:56 AM

## 2019-11-17 NOTE — Progress Notes (Signed)
Called in results of latest ABG to Sheria Lang, RN at [facility] for Dr. Warrick Parisian.

## 2019-11-17 NOTE — Progress Notes (Signed)
eLink Physician-Brief Progress Note Patient Name: Corah Willeford DOB: 09-12-1980 MRN: [id number]   Date of Service  11/17/2019  HPI/Events of Note  Mixed acid-base disorder, respiratory component improved with adjustments to mechanical ventilation but patient is still acidotic.  eICU Interventions  Sodium bicarbonate 1 amp iv, ABG at 5 a.m.        Thomasene Lot Adelard Sanon 11/17/2019, 3:01 AM

## 2019-11-17 NOTE — Progress Notes (Signed)
eLink Physician-Brief Progress Note Patient Name: Griselle Rufer DOB: 10/24/1980 MRN: [id number]   Date of Service  11/17/2019  HPI/Events of Note  ABG: 7.10/ 64.5/ 97 on FIO2 80 %, , RR 32, 6 ml / kg Vt, airway pressure 28  eICU Interventions  Will increase RR to 35 and Vt to 7 ml / kg to boost minute ventilation as long as airway pressures remain < 30. PRN Tylenol ordered for fever and pain.        Thomasene Lot Cline Draheim 11/17/2019, 12:41 AM

## 2019-11-17 NOTE — Progress Notes (Signed)
CRITICAL VALUE ALERT  Critical Value: Calcium   Date & Time Notied: 7/8  03:40  Provider Notified: E-link RN  Orders Received/Actions taken: RN will notify MD. Waiting for orders.

## 2019-11-17 NOTE — Consult Note (Addendum)
Reason for Consult: AKI Referring Physician: Tacy Learn, MD  Rachael Harvey is an 39 y.o. female without significant PMH who presented to an OSH with malaise, cough, fever, and SIB.  EMS noted her to be hypoxic into the 60's and placed on high flow oxygen and brought to [facility] ED and was placed on BiPAP.  She continued to deteriorate and was intubated and admitted to the ICU.  CXR with multifocal pneumonia and blood cultures + for strep pneumonia.  She continued to deteriorate and was transferred to [facility] ICU for further management.  She has since been started on pressors for refractory septic shock and has developed ARDS.  We were consulted due to the development of oliguric, AKI.  The trend in Scr is seen below.  Unknown baseline.    Unable to obtain information from the patient as she is intubated and sedated.  This HPI was produced using existing EMR data.   Trend in Creatinine: Creatinine, Ser  Date/Time Value Ref Range Status  11/17/2019 02:57 AM 4.39 (H) 0.44 - 1.00 mg/dL Final  11/20/2019 07:40 PM 4.04 (H) 0.44 - 1.00 mg/dL Final    PMH:  No past medical history on file.  PSH:  The histories are not reviewed yet. Please review them in the "History" navigator section and refresh this [place].  Allergies:  Allergies  Allergen Reactions  . Peach Flavor   . Shellfish Allergy     Medications:   Prior to Admission medications   Medication Sig Start Date End Date Taking? Authorizing Provider  buPROPion (WELLBUTRIN XL) 300 MG 24 hr tablet Take 300 mg by mouth daily.   Yes [provider]  furosemide (LASIX) 20 MG tablet Take 20 mg by mouth as needed for edema.   Yes [provider]  meloxicam (MOBIC) 7.5 MG tablet Take 7.5 mg by mouth daily.   Yes [provider]  QUEtiapine Fumarate (SEROQUEL XR) 150 MG 24 hr tablet Take 150 mg by mouth at bedtime.   Yes [provider]  sertraline (ZOLOFT) 100 MG tablet Take 200 mg by mouth daily.   Yes  [provider]  topiramate (TOPAMAX) 100 MG tablet Take 100 mg by mouth at bedtime.   Yes [provider]    Inpatient medications: . chlorhexidine gluconate (MEDLINE KIT)  15 mL Mouth Rinse BID  . Chlorhexidine Gluconate Cloth  6 each Topical Daily  . docusate  100 mg Per Tube BID  . heparin  5,000 Units Subcutaneous Q8H  . hydrocortisone sod succinate (SOLU-CORTEF) inj  50 mg Intravenous Q6H  . insulin aspart  0-9 Units Subcutaneous Q4H  . mouth rinse  15 mL Mouth Rinse 10 times per day  . pantoprazole (PROTONIX) IV  40 mg Intravenous Q24H  . polyethylene glycol  17 g Per Tube Daily  . sodium chloride flush  10-40 mL Intracatheter Q12H    Discontinued Meds:   Medications Discontinued During This Encounter  Medication Reason  . norepinephrine (LEVOPHED) 57m in 2549mpremix infusion   . vecuronium (NORCURON) bolus via infusion 10 mg   . lactated ringers infusion   . ceFEPIme (MAXIPIME) 1 g in sodium chloride 0.9 % 100 mL IVPB   . vancomycin (VANCOCIN) IVPB 1000 mg/200 mL premix   . 0.9 %  sodium chloride infusion   . ceFEPIme (MAXIPIME) 2 g in sodium chloride 0.9 % 100 mL IVPB   . vancomycin variable dose per unstable renal function (pharmacist dosing)   . ceFAZolin (ANCEF) IVPB 2g/100  mL premix     Social History:  has no history on file for tobacco use, alcohol use, and drug use.  Family History:  No family history on file.  Review of systems not obtained due to patient factors. Weight change:   Intake/Output Summary (Last 24 hours) at 11/17/2019 1112 Last data filed at 11/17/2019 0600 Gross per 24 hour  Intake 1192.58 ml  Output 200 ml  Net 992.58 ml   BP (!) 113/59   Pulse 79   Temp 99.1 F (37.3 C)   Resp (!) 35   Ht '5\' 10"'  (1.778 m)   Wt 97 kg   SpO2 96%   BMI 30.68 kg/m  Vitals:   11/17/19 0800 11/17/19 0900 11/17/19 1000 11/17/19 1100  BP: (!) 106/55 (!) 108/56 111/60 (!) 113/59  Pulse: 81 82 79 79  Resp: (!) 35 (!) 35 (!) 35 (!) 35   Temp: 99.7 F (37.6 C) 99.5 F (37.5 C) 99.3 F (37.4 C) 99.1 F (37.3 C)  TempSrc:      SpO2: 96% (!) 88% 96% 96%  Weight:      Height:         General appearance: intubated and sedated Head: Normocephalic, without obvious abnormality, atraumatic Resp: rhonchi bilaterally Cardio: regular rate and rhythm, S1, S2 normal, no murmur, click, rub or gallop GI: soft, non-tender; bowel sounds normal; no masses,  no organomegaly Extremities: extremities normal, atraumatic, no cyanosis or edema  Labs: Basic Metabolic Panel: Recent Labs  Lab 12/05/2019 1706 11/20/2019 1859 11/26/2019 1940 12/07/2019 2324 11/17/19 0156 11/17/19 0257 11/17/19 0356  NA 138 140 137 139 138 137 139  K 4.0 4.3 4.0 4.0 4.0 5.0 3.6  CL  --   --  107  --   --  107  --   CO2  --   --  19*  --   --  16*  --   GLUCOSE  --   --  127*  --   --  132*  --   BUN  --   --  87*  --   --  94*  --   CREATININE  --   --  4.04*  --   --  4.39*  --   ALBUMIN  --   --  1.9*  --   --   --   --   CALCIUM  --   --  8.0*  --   --  6.3*  --    Liver Function Tests: Recent Labs  Lab 11/27/2019 1940  AST 75*  ALT 53*  ALKPHOS 69  BILITOT 1.3*  PROT 5.2*  ALBUMIN 1.9*   No results for input(s): LIPASE, AMYLASE in the last 168 hours. No results for input(s): AMMONIA in the last 168 hours. CBC: Recent Labs  Lab 11/30/2019 1940 11/17/2019 1940 12/01/2019 2324 11/17/19 0156 11/17/19 0257 11/17/19 0356  WBC 18.2*  --   --   --  22.1*  --   NEUTROABS 16.2*  --   --   --   --   --   HGB 11.8*   < > 12.9 12.2 11.7* 11.6*  HCT 37.4   < > 38.0 36.0 35.9* 34.0*  MCV 89.9  --   --   --  88.6  --   PLT 99*  --   --   --  105*  --    < > = values in this interval not displayed.   PT/INR: '@LABRCNTIP' (inr:5) Cardiac Enzymes: )No  results for input(s): CKTOTAL, CKMB, CKMBINDEX, TROPONINI in the last 168 hours. CBG: Recent Labs  Lab 12/02/2019 1617 11/10/2019 1923 11/20/2019 2319 11/17/19 0317 11/17/19 0717  GLUCAP 155* 113* 114* 116*  113*    Iron Studies: No results for input(s): IRON, TIBC, TRANSFERRIN, FERRITIN in the last 168 hours.  Xrays/Other Studies: DG CHEST PORT 1 VIEW  Result Date: 11/29/2019 CLINICAL DATA:  Central line placement EXAM: PORTABLE CHEST 1 VIEW COMPARISON:  November 16, 2019 (3:02 a.m.) FINDINGS: Since the prior study there is been interval right internal jugular venous catheter placement with its distal tip noted at the junction of the superior vena cava and right atrium. A right-sided PICC line is suspected. Its distal tip is limited in visualization and is not seen beyond the right apex. Stable endotracheal tube and nasogastric tube positioning is noted. There are stable marked severity diffuse bilateral infiltrates. No pleural effusion or pneumothorax is identified. The heart size and mediastinal contours are within normal limits. The visualized skeletal structures are unremarkable. IMPRESSION: 1. Interval right internal jugular venous catheter placement with its distal tip noted at the junction of the superior vena cava and right atrium. 2. Probable right-sided PICC line. Electronically Signed   By: Virgina Norfolk M.D.   On: 11/24/2019 19:48     Assessment/Plan: 1.  AKI- presumably ischemic ATN in setting of severe septic shock due to pneumoncoccal pneumonia.  Further complicated by metabolic acidosis and hyperkalemia.   1. No emergent indication for dialysis at this time, however given worsening acidosis, will repeat labs at noon and re-evaluate.  2. Will likely require CRRT in the next 24 hours if no significant improvement.  3. Discussed case with PCCM who is in agreement. 4. Start isotonic bicarb and follow UOP and daily Scr. 2. VDRF- due to multifocal pneumonia complicated by ARDS- per PCCM 3. Septic shock due to pneumococcal pneumonia with bacteremia.  Currently on 12 mcg of levo. 4. Metabolic acidosis- due to #1.  Given multiple doses of IV bicarb.  Will start isotonic bicarb drip and follow.    5. Hypocalcemia- repleted with IV calcium gluconate (given before starting bicarb) 6. Hyperkalemia- due to #1.  As above   Donetta Potts 11/17/2019, 11:12 AM

## 2019-11-18 ENCOUNTER — Inpatient Hospital Stay (HOSPITAL_COMMUNITY): Payer: Medicaid Other

## 2019-11-18 DIAGNOSIS — R7881 Bacteremia: Secondary | ICD-10-CM

## 2019-11-18 LAB — POCT I-STAT 7, (LYTES, BLD GAS, ICA,H+H)
Acid-base deficit: 7 mmol/L — ABNORMAL HIGH (ref 0.0–2.0)
Bicarbonate: 21 mmol/L (ref 20.0–28.0)
Calcium, Ion: 1.16 mmol/L (ref 1.15–1.40)
HCT: 34 % — ABNORMAL LOW (ref 36.0–46.0)
Hemoglobin: 11.6 g/dL — ABNORMAL LOW (ref 12.0–15.0)
O2 Saturation: 97 %
Patient temperature: 99
Potassium: 3.2 mmol/L — ABNORMAL LOW (ref 3.5–5.1)
Sodium: 138 mmol/L (ref 135–145)
TCO2: 23 mmol/L (ref 22–32)
pCO2 arterial: 50.5 mmHg — ABNORMAL HIGH (ref 32.0–48.0)
pH, Arterial: 7.229 — ABNORMAL LOW (ref 7.350–7.450)
pO2, Arterial: 112 mmHg — ABNORMAL HIGH (ref 83.0–108.0)

## 2019-11-18 LAB — RENAL FUNCTION PANEL
Albumin: 1.8 g/dL — ABNORMAL LOW (ref 3.5–5.0)
Albumin: 1.7 g/dL — ABNORMAL LOW (ref 3.5–5.0)
Anion gap: 16 — ABNORMAL HIGH (ref 5–15)
Anion gap: 14 (ref 5–15)
BUN: 126 mg/dL — ABNORMAL HIGH (ref 6–20)
BUN: 132 mg/dL — ABNORMAL HIGH (ref 6–20)
CO2: 18 mmol/L — ABNORMAL LOW (ref 22–32)
CO2: 21 mmol/L — ABNORMAL LOW (ref 22–32)
Calcium: 8.5 mg/dL — ABNORMAL LOW (ref 8.9–10.3)
Calcium: 8.3 mg/dL — ABNORMAL LOW (ref 8.9–10.3)
Chloride: 103 mmol/L (ref 98–111)
Chloride: 106 mmol/L (ref 98–111)
Creatinine, Ser: 4.41 mg/dL — ABNORMAL HIGH (ref 0.44–1.00)
Creatinine, Ser: 3.87 mg/dL — ABNORMAL HIGH (ref 0.44–1.00)
GFR calc Af Amer: 14 mL/min — ABNORMAL LOW (ref >60)
GFR calc Af Amer: 16 mL/min — ABNORMAL LOW (ref >60)
GFR calc non Af Amer: 12 mL/min — ABNORMAL LOW (ref >60)
GFR calc non Af Amer: 14 mL/min — ABNORMAL LOW (ref >60)
Glucose, Bld: 174 mg/dL — ABNORMAL HIGH (ref 70–99)
Glucose, Bld: 111 mg/dL — ABNORMAL HIGH (ref 70–99)
Phosphorus: 6.8 mg/dL — ABNORMAL HIGH (ref 2.5–4.6)
Phosphorus: 6.6 mg/dL — ABNORMAL HIGH (ref 2.5–4.6)
Potassium: 3.1 mmol/L — ABNORMAL LOW (ref 3.5–5.1)
Potassium: 3.2 mmol/L — ABNORMAL LOW (ref 3.5–5.1)
Sodium: 137 mmol/L (ref 135–145)
Sodium: 141 mmol/L (ref 135–145)

## 2019-11-18 LAB — RAPID URINE DRUG SCREEN, HOSP PERFORMED
Amphetamines: NOT DETECTED
Barbiturates: NOT DETECTED
Benzodiazepines: POSITIVE — AB
Cocaine: NOT DETECTED
Opiates: NOT DETECTED
Tetrahydrocannabinol: NOT DETECTED

## 2019-11-18 LAB — ECHOCARDIOGRAM COMPLETE
Height: 70 in
Weight: 3456.81 oz

## 2019-11-18 LAB — CBC
HCT: 35.9 % — ABNORMAL LOW (ref 36.0–46.0)
Hemoglobin: 11.6 g/dL — ABNORMAL LOW (ref 12.0–15.0)
MCH: 28.8 pg (ref 26.0–34.0)
MCHC: 32.3 g/dL (ref 30.0–36.0)
MCV: 89.1 fL (ref 80.0–100.0)
Platelets: 85 10*3/uL — ABNORMAL LOW (ref 150–400)
RBC: 4.03 MIL/uL (ref 3.87–5.11)
RDW: 17.7 % — ABNORMAL HIGH (ref 11.5–15.5)
WBC: 26.3 10*3/uL — ABNORMAL HIGH (ref 4.0–10.5)
nRBC: 0.1 % (ref 0.0–0.2)

## 2019-11-18 LAB — MAGNESIUM
Magnesium: 2.5 mg/dL — ABNORMAL HIGH (ref 1.7–2.4)
Magnesium: 2.5 mg/dL — ABNORMAL HIGH (ref 1.7–2.4)

## 2019-11-18 LAB — GLUCOSE, CAPILLARY
Glucose-Capillary: 114 mg/dL — ABNORMAL HIGH (ref 70–99)
Glucose-Capillary: 124 mg/dL — ABNORMAL HIGH (ref 70–99)
Glucose-Capillary: 142 mg/dL — ABNORMAL HIGH (ref 70–99)
Glucose-Capillary: 147 mg/dL — ABNORMAL HIGH (ref 70–99)
Glucose-Capillary: 147 mg/dL — ABNORMAL HIGH (ref 70–99)
Glucose-Capillary: 163 mg/dL — ABNORMAL HIGH (ref 70–99)
Glucose-Capillary: 93 mg/dL (ref 70–99)

## 2019-11-18 LAB — POCT ACTIVATED CLOTTING TIME
Activated Clotting Time: 131 seconds
Activated Clotting Time: 142 seconds

## 2019-11-18 LAB — PROTIME-INR
INR: 1.2 (ref 0.8–1.2)
Prothrombin Time: 14.9 seconds (ref 11.4–15.2)

## 2019-11-18 LAB — HCG, QUANTITATIVE, PREGNANCY: hCG, Beta Chain, Quant, S: 5 m[IU]/mL — ABNORMAL HIGH (ref <5)

## 2019-11-18 LAB — FIBRINOGEN: Fibrinogen: 636 mg/dL — ABNORMAL HIGH (ref 210–475)

## 2019-11-18 LAB — TRIGLYCERIDES: Triglycerides: 443 mg/dL — ABNORMAL HIGH (ref <150)

## 2019-11-18 MED ORDER — PRISMASOL BGK 4/2.5 32-4-2.5 MEQ/L IV SOLN
INTRAVENOUS | Status: DC
  Filled 2019-11-18 (×53): qty 5000

## 2019-11-18 MED ORDER — HEPARIN SODIUM (PORCINE) 1000 UNIT/ML DIALYSIS
1000.0000 [IU] | INTRAMUSCULAR | Status: DC | PRN
  Filled 2019-11-18: qty 6

## 2019-11-18 MED ORDER — STERILE WATER FOR INJECTION IV SOLN
INTRAVENOUS | Status: DC
  Filled 2019-11-18 (×3): qty 150

## 2019-11-18 MED ORDER — SODIUM CHLORIDE 0.9 % IV SOLN
250.0000 [IU]/h | INTRAVENOUS | Status: DC
  Administered 2019-11-18: 250 [IU]/h via INTRAVENOUS_CENTRAL
  Administered 2019-11-18: 1000 [IU]/h via INTRAVENOUS_CENTRAL
  Administered 2019-11-19: 1600 [IU]/h via INTRAVENOUS_CENTRAL
  Administered 2019-11-19: 1350 [IU]/h via INTRAVENOUS_CENTRAL
  Administered 2019-11-19 – 2019-11-20 (×2): 1600 [IU]/h via INTRAVENOUS_CENTRAL
  Administered 2019-11-20 – 2019-11-21 (×6): 1550 [IU]/h via INTRAVENOUS_CENTRAL
  Administered 2019-11-22 – 2019-11-23 (×6): 1600 [IU]/h via INTRAVENOUS_CENTRAL
  Filled 2019-11-18 (×19): qty 2

## 2019-11-18 MED ORDER — MIDAZOLAM HCL 2 MG/2ML IJ SOLN
2.0000 mg | INTRAMUSCULAR | Status: AC | PRN
  Administered 2019-11-18 (×3): 2 mg via INTRAVENOUS

## 2019-11-18 MED ORDER — MIDAZOLAM HCL 2 MG/2ML IJ SOLN
2.0000 mg | INTRAMUSCULAR | Status: DC | PRN
  Administered 2019-11-18 – 2019-11-22 (×9): 2 mg via INTRAVENOUS

## 2019-11-18 MED ORDER — QUETIAPINE FUMARATE 50 MG PO TABS
100.0000 mg | ORAL_TABLET | Freq: Two times a day (BID) | ORAL | Status: DC
  Administered 2019-11-18: 100 mg via ORAL
  Filled 2019-11-18: qty 2

## 2019-11-18 MED ORDER — QUETIAPINE FUMARATE 50 MG PO TABS
100.0000 mg | ORAL_TABLET | Freq: Two times a day (BID) | ORAL | Status: DC
  Administered 2019-11-18 – 2019-11-23 (×10): 100 mg
  Filled 2019-11-18 (×10): qty 2

## 2019-11-18 MED ORDER — POTASSIUM CHLORIDE 20 MEQ/15ML (10%) PO SOLN
40.0000 meq | Freq: Once | ORAL | Status: AC
  Administered 2019-11-18: 40 meq via ORAL

## 2019-11-18 MED ORDER — SERTRALINE HCL 50 MG PO TABS
200.0000 mg | ORAL_TABLET | Freq: Every day | ORAL | Status: DC
  Administered 2019-11-18: 200 mg via ORAL
  Filled 2019-11-18: qty 4

## 2019-11-18 MED ORDER — POTASSIUM CHLORIDE 20 MEQ/15ML (10%) PO SOLN: 40.0000 meq | ORAL | Status: DC

## 2019-11-18 MED ORDER — PRISMASOL BGK 4/2.5 32-4-2.5 MEQ/L REPLACEMENT SOLN
Status: DC
  Filled 2019-11-18 (×14): qty 5000

## 2019-11-18 MED ORDER — HEPARIN (PORCINE) 2000 UNITS/L FOR CRRT
INTRAVENOUS_CENTRAL | Status: DC | PRN
  Filled 2019-11-18 (×3): qty 1000
  Filled 2019-11-18: qty 500
  Filled 2019-11-18 (×2): qty 1000

## 2019-11-18 MED ORDER — POTASSIUM CHLORIDE 10 MEQ/50ML IV SOLN
10.0000 meq | Freq: Once | INTRAVENOUS | Status: DC
  Filled 2019-11-18: qty 50

## 2019-11-18 MED ORDER — HEPARIN SODIUM (PORCINE) 1000 UNIT/ML DIALYSIS
1000.0000 [IU] | INTRAMUSCULAR | Status: DC | PRN
  Filled 2019-11-18 (×2): qty 3
  Filled 2019-11-18: qty 6

## 2019-11-18 MED ORDER — HEPARIN BOLUS VIA INFUSION (CRRT)
1000.0000 [IU] | INTRAVENOUS | Status: DC | PRN
  Administered 2019-11-18 (×2): 1000 [IU] via INTRAVENOUS_CENTRAL
  Filled 2019-11-18: qty 1000

## 2019-11-18 MED ORDER — QUETIAPINE FUMARATE ER 50 MG PO TB24: 100.0000 mg | ORAL_TABLET | Freq: Two times a day (BID) | ORAL | Status: DC

## 2019-11-18 MED ORDER — MIDAZOLAM 50MG/50ML (1MG/ML) PREMIX INFUSION
0.5000 mg/h | INTRAVENOUS | Status: DC
  Administered 2019-11-18: 0.5 mg/h via INTRAVENOUS
  Administered 2019-11-18: 4 mg/h via INTRAVENOUS
  Administered 2019-11-19: 10 mg/h via INTRAVENOUS
  Administered 2019-11-19: 7 mg/h via INTRAVENOUS
  Administered 2019-11-19: 6 mg/h via INTRAVENOUS
  Administered 2019-11-19 – 2019-11-21 (×5): 7 mg/h via INTRAVENOUS
  Administered 2019-11-21: 8 mg/h via INTRAVENOUS
  Administered 2019-11-21: 2 mg/h via INTRAVENOUS
  Administered 2019-11-22 – 2019-11-23 (×2): 3 mg/h via INTRAVENOUS
  Administered 2019-11-23: 5 mg/h via INTRAVENOUS
  Filled 2019-11-18 (×15): qty 50

## 2019-11-18 MED ORDER — SERTRALINE HCL 50 MG PO TABS
200.0000 mg | ORAL_TABLET | Freq: Every day | ORAL | Status: DC
  Administered 2019-11-19 – 2019-11-23 (×5): 200 mg
  Filled 2019-11-18 (×5): qty 4

## 2019-11-18 MED ORDER — POTASSIUM CHLORIDE 20 MEQ/15ML (10%) PO SOLN
20.0000 meq | ORAL | Status: DC
  Filled 2019-11-18 (×2): qty 15

## 2019-11-18 NOTE — Procedures (Signed)
Central Venous Catheter Insertion Procedure Note Rachael Harvey [id number] 1980/06/09  Procedure: Insertion of Central Venous Catheter Indications: CRRT  Procedure Details Consent: Risks of procedure as well as the alternatives and risks of each were explained to the (patient's motehr  Consent for procedure obtained. Time Out: Verified patient identification, verified procedure, site/side was marked, verified correct patient position, special equipment/implants available, medications/allergies/relevent history reviewed, required imaging and test results available.  Performed  Maximum sterile technique was used including antiseptics, cap, gloves, gown, hand hygiene, mask and sheet. Skin prep: Chlorhexidine; local anesthetic administered A antimicrobial bonded/coated triple lumen catheter was placed in the left femoral vein using the Seldinger technique. The femoral vein was used due to already having a central line in right internal jugular vein. Also one attempt was made at left internal jugular vein. The vessel was punctured on first try with blood return, but the wire did not pass smoothly so I stopped at approximately 5 cm of wire. The wire was not forced. US showed a possible clot in the vessel and the procedure was aborted.   Evaluation Blood flow good Complications: No apparent complications Patient did tolerate procedure well. Chest X-ray ordered to verify placement.  CXR: normal.  Milus Banister 11/18/2019, 11:55 AM

## 2019-11-18 NOTE — Progress Notes (Signed)
Patient ID: Rachael Harvey, female   DOB: Nov 24, 1980, 39 y.o.   MRN: [id number] S: No significant improvement overnight.  Some increase in UOP but remains oliguric.  PCP noted that last Scr was 1 but does report polysubstance abuse. O:BP (!) 99/53   Pulse 84   Temp 99 F (37.2 C)   Resp 16   Ht 5' 10" (1.778 m)   Wt 98 kg   SpO2 (!) 89%   BMI 31.00 kg/m   Intake/Output Summary (Last 24 hours) at 11/18/2019 0937 Last data filed at 11/18/2019 0800 Gross per 24 hour  Intake 4773.28 ml  Output 745 ml  Net 4028.28 ml   Intake/Output: I/O last 3 completed shifts: In: 2836 [I.V.:4175.4; NG/GT:987.4; IV Piggyback:450.1] Out: 870 [Urine:870]  Intake/Output this shift:  Total I/O In: 352.9 [I.V.:142.9; NG/GT:210] Out: 75 [Urine:75] Weight change: 1 kg Gen: intubated and sedated CVS: RRR, no rub Resp: occ rhonchi Abd: +BS, soft,Nt/Nd Ext: no edema  Recent Labs  Lab 12/10/2019 1940 11/25/2019 1940 11/18/2019 2324 11/17/19 0156 11/17/19 0257 11/17/19 0356 11/17/19 1142 11/18/19 0519 11/18/19 0520  NA 137   < > 139 138 137 139 139 137 138  K 4.0   < > 4.0 4.0 5.0 3.6 3.8 3.1* 3.2*  CL 107  --   --   --  107  --  109 103  --   CO2 19*  --   --   --  16*  --  15* 18*  --   GLUCOSE 127*  --   --   --  132*  --  134* 174*  --   BUN 87*  --   --   --  94*  --  104* 126*  --   CREATININE 4.04*  --   --   --  4.39*  --  4.51* 4.41*  --   ALBUMIN 1.9*  --   --   --   --   --  1.9* 1.8*  --   CALCIUM 8.0*  --   --   --  6.3*  --  8.6* 8.5*  --   PHOS  --   --   --   --   --   --  6.6* 6.8*  --   AST 75*  --   --   --   --   --   --   --   --   ALT 53*  --   --   --   --   --   --   --   --    < > = values in this interval not displayed.   Liver Function Tests: Recent Labs  Lab 12/08/2019 1940 11/17/19 1142 11/18/19 0519  AST 75*  --   --   ALT 53*  --   --   ALKPHOS 69  --   --   BILITOT 1.3*  --   --   PROT 5.2*  --   --   ALBUMIN 1.9* 1.9* 1.8*   No results for input(s): LIPASE,  AMYLASE in the last 168 hours. No results for input(s): AMMONIA in the last 168 hours. CBC: Recent Labs  Lab 12/05/2019 1940 11/19/2019 2324 11/17/19 0257 11/17/19 0257 11/17/19 0356 11/18/19 0519 11/18/19 0520  WBC 18.2*  --  22.1*  --   --  26.3*  --   NEUTROABS 16.2*  --   --   --   --   --   --  HGB 11.8*   < > 11.7*   < > 11.6* 11.6* 11.6*  HCT 37.4   < > 35.9*   < > 34.0* 35.9* 34.0*  MCV 89.9  --  88.6  --   --  89.1  --   PLT 99*  --  105*  --   --  85*  --    < > = values in this interval not displayed.   Cardiac Enzymes: No results for input(s): CKTOTAL, CKMB, CKMBINDEX, TROPONINI in the last 168 hours. CBG: Recent Labs  Lab 11/17/19 1607 11/17/19 1921 11/17/19 2310 11/18/19 0316 11/18/19 0712  GLUCAP 128* 146* 169* 147* 163*    Iron Studies: No results for input(s): IRON, TIBC, TRANSFERRIN, FERRITIN in the last 72 hours. Studies/Results: DG CHEST PORT 1 VIEW  Result Date: 11/10/2019 CLINICAL DATA:  Central line placement EXAM: PORTABLE CHEST 1 VIEW COMPARISON:  November 16, 2019 (3:02 a.m.) FINDINGS: Since the prior study there is been interval right internal jugular venous catheter placement with its distal tip noted at the junction of the superior vena cava and right atrium. A right-sided PICC line is suspected. Its distal tip is limited in visualization and is not seen beyond the right apex. Stable endotracheal tube and nasogastric tube positioning is noted. There are stable marked severity diffuse bilateral infiltrates. No pleural effusion or pneumothorax is identified. The heart size and mediastinal contours are within normal limits. The visualized skeletal structures are unremarkable. IMPRESSION: 1. Interval right internal jugular venous catheter placement with its distal tip noted at the junction of the superior vena cava and right atrium. 2. Probable right-sided PICC line. Electronically Signed   By: Virgina Norfolk M.D.   On: 12/10/2019 19:48   . chlorhexidine  gluconate (MEDLINE KIT)  15 mL Mouth Rinse BID  . Chlorhexidine Gluconate Cloth  6 each Topical Daily  . docusate  100 mg Per Tube BID  . feeding supplement (PROSource TF)  45 mL Per Tube BID  . heparin  5,000 Units Subcutaneous Q8H  . insulin aspart  0-9 Units Subcutaneous Q4H  . mouth rinse  15 mL Mouth Rinse 10 times per day  . pantoprazole (PROTONIX) IV  40 mg Intravenous Q24H  . polyethylene glycol  17 g Per Tube Daily  . QUEtiapine  100 mg Oral BID  . sertraline  200 mg Oral Daily  . sodium chloride flush  10-40 mL Intracatheter Q12H    BMET    Component Value Date/Time   NA 138 11/18/2019 0520   K 3.2 (L) 11/18/2019 0520   CL 103 11/18/2019 0519   CO2 18 (L) 11/18/2019 0519   GLUCOSE 174 (H) 11/18/2019 0519   BUN 126 (H) 11/18/2019 0519   CREATININE 4.41 (H) 11/18/2019 0519   CALCIUM 8.5 (L) 11/18/2019 0519   GFRNONAA 12 (L) 11/18/2019 0519   GFRAA 14 (L) 11/18/2019 0519   CBC    Component Value Date/Time   WBC 26.3 (H) 11/18/2019 0519   RBC 4.03 11/18/2019 0519   HGB 11.6 (L) 11/18/2019 0520   HCT 34.0 (L) 11/18/2019 0520   PLT 85 (L) 11/18/2019 0519   MCV 89.1 11/18/2019 0519   MCH 28.8 11/18/2019 0519   MCHC 32.3 11/18/2019 0519   RDW 17.7 (H) 11/18/2019 0519   LYMPHSABS 0.5 (L) 11/10/2019 1940   MONOABS 0.5 11/28/2019 1940   EOSABS 0.0 12/08/2019 1940   BASOSABS 0.0 12/02/2019 1940     Assessment/Plan: 1.  AKI- presumably ischemic ATN in setting  of severe septic shock due to pneumoncoccal pneumonia.  Further complicated by metabolic acidosis and hyperkalemia.   1. Will initiate CRRT today given ongoing sepsis and metabolic acidosis  2. Discussed case with PCCM who is in agreement. 3. Will change isotonic bicarb drip to replacement fluids once CRRT is started.  2. VDRF- due to multifocal pneumonia complicated by ARDS- per PCCM 3. Septic shock due to pneumococcal pneumonia with bacteremia.  Currently on 10 mcg of levo. 4. Metabolic acidosis- due to #1.   Given multiple doses of IV bicarb.  Will continue isotonic bicarb drip and follow.   5. Hypocalcemia- repleted with IV calcium gluconate (given before starting bicarb) 6. Hyperkalemia- due to #1.  improved with treatment.  Donetta Potts, MD [employer] [phone]

## 2019-11-18 NOTE — Progress Notes (Signed)
Patient changed to 100% during procedure (line placement. HD cath) . RT will continue to monitor & titrate Fio2 down.

## 2019-11-18 NOTE — Progress Notes (Addendum)
NAME:  Rachael Harvey, MRN:  [id number], DOB:  1980/08/11, LOS: 2 ADMISSION DATE:  12/05/2019,   CHIEF COMPLAINT:  Shortness of breath  Brief History   39 year old female was transferred from outside hospital with acute hypoxic/hypercapnic respiratory failure, ARDS and acute kidney injury  History of present illness   39 year old female with history of anxiety and depression who was transferred from [facility] for worsening respiratory failure. Patient is intubated so most of the history is taken from patient's chart, as per chart patient was not feeling well for last few days with a cough, shortness of breath and fever.  When EMS was called patient was noted to be hypoxic into the 60s, she was put on high flow and was transferred to [facility] emergency department, where she was put on BiPAP but she continued to remain hypoxic so decision was made to intubate, she was admitted in the hospital with acute hypoxic respiratory failure due to multifocal pneumonia. Over the last 2 days she has been getting IV antibiotics, but her condition continued to deteriorate with hypoxia, hypercapnia and acute metabolic acidosis associated with acute kidney injury so she was transferred to [facility] for further management.  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 Patient has history of thrombocytopenia, not leukemia.  She has recent hx of positive UDS for amphetamines and opioids.   Objective   Blood pressure (!) 98/52, pulse 78, temperature 99.3 F (37.4 C), resp. rate (!) 35, height 5\' 10"  (1.778 m), weight 98 kg, SpO2 95 %.    Vent Mode: PRVC FiO2 (%):  [50 %-70 %] 60 % Set Rate:  [35 bmp] 35 bmp Vt Set:  [480 mL] 480 mL PEEP:  [8 cmH20] 8 cmH20 Plateau Pressure:  [23 cmH20-31 cmH20] 31 cmH20   Intake/Output Summary (Last 24 hours) at 11/18/2019 0650 Last data filed at 11/18/2019 0600 Gross per 24 hour  Intake 4420.42 ml  Output 670 ml  Net 3750.42 ml   Filed Weights   11/14/2019 1608 11/18/19 0500  Weight: 97 kg 98 kg    Examination: General: critically ill appearing female, sedated HENT: ETT, trachea midline Lungs: diffuse bilateral crackles , no wheezing  Cardiovascular: RRR. No LE edema. Abdomen: soft. bs active Extremities: warm, dry Neuro: sedated, pupils equal and reactive Skin: No rash   ABG 7.23 / 50.5/ 23/ 112 on PRVC FiO2 60  Noted labs: Bun 126, K 3.1, WBC 26, Hgb 11.6, TG 443 Resolved Hospital Problem list     Assessment & Plan:  Acute hypoxic/hypercapnic respiratory failure due to severe ARDS> multifocal pneumonia requiring mechanical ventilation.  AGMA Continues to have respiratory acidosis on ABG, bicarb up to 18 from 15  Weaning down FiO2 , currently on FiO2 50 Sedated 200 mcg fentanyl, 01/17/20 propofol , TG 443 , stop propofol, will start versed and add back some of her home medications .  Plan: Continue MV with pressure control per ARDs net protocol Wean as able  She is on Seroquel 150 qhs outpatient, will start at 100 BID. Will also start back her Sertraline.  Sedation with fentanyl and Versed for RAAS goal of 0 to-1  Septic shockdue to pneumococcal pneumonia and pneumococcal bacteremia Leukopenia/thrombocytopenia 2/2 above Blood cultures at [facility] show pan sensitive strep pneumo.  Down from 11 to of levo. Afebrile overnight, abx deescalated to  cefazolin 7/8.  Plan: Continue levo. Titrate to MAP>65 Continue cefazolin Patient will need echo, can start with TTE.   Acute kidney injury.  Hypokalemia AGMA Likely from ischemic ATN 2/2 to septic shock as above.  Hyperkalemia improved, hypokalemia to 3.1 this am. Will replete to 3.5. BUN continues to increase overnight.   Discussed with nephrology this morning and recommend starting CRRT today.  Minimal urine output overnight. On sodium bicarbonate at 176ml/hr, Bicarb up to 18 from 15 overnight. Plan: Greatly appreciate nephrology consult and recommendations  Continue to monitor renal function and UOP closely Patient will need HD line and plan to start CRRT  Bipolar Disorder Patient is on Seroquel 150 qhs at home. Will adjust dosing to help with sedation as mentioned above.  Plan: - Seroquel 100 mg BID - Sertraline 200 mg   Substance use disorder.  History of polysubstance use disorder. Plan:  Will continue to monitor.    Best practice:  Diet: TF Pain/Anxiety/Delirium protocol (if indicated): N/A VAP protocol (if indicated): Y DVT prophylaxis: Subcu heparin GI prophylaxis: Famotidine Glucose control: Sliding scale insulin Mobility: Bedrest Code Status: Full code Family Communication: mother updated 7/8  Disposition: ICU  Labs   CBC: Recent Labs  Lab December 04, 2019 1940 12/04/2019 2324 11/17/19 0156 11/17/19 0257 11/17/19 0356 11/18/19 0519 11/18/19 0520  WBC 18.2*  --   --  22.1*  --  26.3*  --   NEUTROABS 16.2*  --   --   --   --   --   --   HGB 11.8*   < > 12.2 11.7* 11.6* 11.6* 11.6*  HCT 37.4   < > 36.0 35.9* 34.0* 35.9* 34.0*  MCV 89.9  --   --  88.6  --  89.1  --   PLT 99*  --   --  105*  --  85*  --    < > = values in this interval not displayed.    Basic Metabolic Panel: Recent Labs  Lab December 04, 2019 1940 12-04-19 2324 11/17/19 0257 11/17/19 0356 11/17/19 1142 11/17/19 1610 11/18/19 0519 11/18/19 0520  NA 137   < > 137 139 139  --  137 138  K  4.0   < > 5.0 3.6 3.8  --  3.1* 3.2*  CL 107  --  107  --  109  --  103  --   CO2 19*  --  16*  --  15*  --  18*  --   GLUCOSE 127*  --  132*  --  134*  --  174*  --   BUN 87*  --  94*  --  104*  --  126*  --   CREATININE 4.04*  --  4.39*  --  4.51*  --  4.41*  --   CALCIUM 8.0*  --  6.3*  --  8.6*  --  8.5*  --   MG 2.5*  --   --   --   --  2.6* 2.5*  --   PHOS  --   --   --   --  6.6*  --  6.8*  --    < > = values in this interval not displayed.   GFR: Estimated Creatinine Clearance: 21.7 mL/min (A) (by C-G formula based on SCr of 4.41 mg/dL (H)). Recent Labs  Lab Dec 07, 2019 1940 Dec 07, 2019 2320 11/17/19 0257 11/18/19 0519  PROCALCITON  --   --  46.19  --   WBC 18.2*  --  22.1* 26.3*  LATICACIDVEN 1.1 1.3  --   --     Liver Function Tests: Recent Labs  Lab 12-07-19 1940 11/17/19 1142 11/18/19 0519  AST 75*  --   --   ALT 53*  --   --   ALKPHOS 69  --   --   BILITOT 1.3*  --   --   PROT 5.2*  --   --   ALBUMIN 1.9* 1.9* 1.8*   No results for input(s): LIPASE, AMYLASE in the last 168 hours. No results for input(s): AMMONIA in the last 168 hours.  ABG    Component Value Date/Time   PHART 7.229 (L) 11/18/2019 0520   PCO2ART 50.5 (H) 11/18/2019 0520   PO2ART 112 (H) 11/18/2019 0520   HCO3 21.0 11/18/2019 0520   TCO2 23 11/18/2019 0520   ACIDBASEDEF 7.0 (H) 11/18/2019 0520   O2SAT 97.0 11/18/2019 0520     Coagulation Profile: Recent Labs  Lab 12-07-19 1940 11/17/19 0257  INR 1.3* 1.3*    Cardiac Enzymes: No results for input(s): CKTOTAL, CKMB, CKMBINDEX, TROPONINI in the last 168 hours.  HbA1C: Hgb A1c MFr Bld  Date/Time Value Ref Range Status  12-07-2019 07:40 PM 5.7 (H) 4.8 - 5.6 % Final    Comment:    (NOTE) Pre diabetes:          5.7%-6.4%  Diabetes:              >6.4%  Glycemic control for   <7.0% adults with diabetes     CBG: Recent Labs  Lab 11/17/19 1141 11/17/19 1607 11/17/19 1921 11/17/19 2310 11/18/19 0316  GLUCAP 118* 128* 146*  169* 147*    Milus Banister, MD Internal Medicine Resident PGY-2 11/18/19 6:50 AM

## 2019-11-18 NOTE — Progress Notes (Signed)
  Echocardiogram 2D Echocardiogram has been performed.  Leta Jungling M 11/18/2019, 3:25 PM

## 2019-11-19 ENCOUNTER — Inpatient Hospital Stay (HOSPITAL_COMMUNITY): Payer: Medicaid Other

## 2019-11-19 DIAGNOSIS — R6521 Severe sepsis with septic shock: Secondary | ICD-10-CM

## 2019-11-19 DIAGNOSIS — A419 Sepsis, unspecified organism: Secondary | ICD-10-CM

## 2019-11-19 LAB — CBC
HCT: 35 % — ABNORMAL LOW (ref 36.0–46.0)
Hemoglobin: 11.7 g/dL — ABNORMAL LOW (ref 12.0–15.0)
MCH: 29.2 pg (ref 26.0–34.0)
MCHC: 33.4 g/dL (ref 30.0–36.0)
MCV: 87.3 fL (ref 80.0–100.0)
Platelets: 49 10*3/uL — ABNORMAL LOW (ref 150–400)
RBC: 4.01 MIL/uL (ref 3.87–5.11)
RDW: 16.8 % — ABNORMAL HIGH (ref 11.5–15.5)
WBC: 10.1 10*3/uL (ref 4.0–10.5)
nRBC: 0.3 % — ABNORMAL HIGH (ref 0.0–0.2)

## 2019-11-19 LAB — POCT ACTIVATED CLOTTING TIME
Activated Clotting Time: 153 seconds
Activated Clotting Time: 153 seconds
Activated Clotting Time: 158 seconds
Activated Clotting Time: 159 seconds
Activated Clotting Time: 159 seconds
Activated Clotting Time: 164 seconds
Activated Clotting Time: 169 seconds
Activated Clotting Time: 169 seconds
Activated Clotting Time: 164 seconds
Activated Clotting Time: 164 seconds
Activated Clotting Time: 180 seconds
Activated Clotting Time: 180 seconds
Activated Clotting Time: 186 seconds
Activated Clotting Time: 219 seconds
Activated Clotting Time: 208 seconds

## 2019-11-19 LAB — POCT I-STAT 7, (LYTES, BLD GAS, ICA,H+H)
Acid-Base Excess: 1 mmol/L (ref 0.0–2.0)
Acid-Base Excess: 2 mmol/L (ref 0.0–2.0)
Bicarbonate: 28.5 mmol/L — ABNORMAL HIGH (ref 20.0–28.0)
Bicarbonate: 28.2 mmol/L — ABNORMAL HIGH (ref 20.0–28.0)
Calcium, Ion: 1.15 mmol/L (ref 1.15–1.40)
Calcium, Ion: 1.15 mmol/L (ref 1.15–1.40)
HCT: 35 % — ABNORMAL LOW (ref 36.0–46.0)
HCT: 37 % (ref 36.0–46.0)
Hemoglobin: 11.9 g/dL — ABNORMAL LOW (ref 12.0–15.0)
Hemoglobin: 12.6 g/dL (ref 12.0–15.0)
O2 Saturation: 79 %
O2 Saturation: 88 %
Patient temperature: 36.5
Potassium: 2.9 mmol/L — ABNORMAL LOW (ref 3.5–5.1)
Potassium: 2.9 mmol/L — ABNORMAL LOW (ref 3.5–5.1)
Sodium: 142 mmol/L (ref 135–145)
Sodium: 142 mmol/L (ref 135–145)
TCO2: 30 mmol/L (ref 22–32)
TCO2: 30 mmol/L (ref 22–32)
pCO2 arterial: 53.9 mmHg — ABNORMAL HIGH (ref 32.0–48.0)
pCO2 arterial: 52 mmHg — ABNORMAL HIGH (ref 32.0–48.0)
pH, Arterial: 7.329 — ABNORMAL LOW (ref 7.350–7.450)
pH, Arterial: 7.342 — ABNORMAL LOW (ref 7.350–7.450)
pO2, Arterial: 47 mmHg — ABNORMAL LOW (ref 83.0–108.0)
pO2, Arterial: 58 mmHg — ABNORMAL LOW (ref 83.0–108.0)

## 2019-11-19 LAB — RENAL FUNCTION PANEL
Albumin: 1.7 g/dL — ABNORMAL LOW (ref 3.5–5.0)
Albumin: 1.7 g/dL — ABNORMAL LOW (ref 3.5–5.0)
Anion gap: 12 (ref 5–15)
Anion gap: 9 (ref 5–15)
BUN: 87 mg/dL — ABNORMAL HIGH (ref 6–20)
BUN: 62 mg/dL — ABNORMAL HIGH (ref 6–20)
CO2: 25 mmol/L (ref 22–32)
CO2: 26 mmol/L (ref 22–32)
Calcium: 8 mg/dL — ABNORMAL LOW (ref 8.9–10.3)
Calcium: 8.2 mg/dL — ABNORMAL LOW (ref 8.9–10.3)
Chloride: 104 mmol/L (ref 98–111)
Chloride: 106 mmol/L (ref 98–111)
Creatinine, Ser: 2.14 mg/dL — ABNORMAL HIGH (ref 0.44–1.00)
Creatinine, Ser: 1.45 mg/dL — ABNORMAL HIGH (ref 0.44–1.00)
GFR calc Af Amer: 33 mL/min — ABNORMAL LOW (ref >60)
GFR calc Af Amer: 52 mL/min — ABNORMAL LOW (ref >60)
GFR calc non Af Amer: 28 mL/min — ABNORMAL LOW (ref >60)
GFR calc non Af Amer: 45 mL/min — ABNORMAL LOW (ref >60)
Glucose, Bld: 125 mg/dL — ABNORMAL HIGH (ref 70–99)
Glucose, Bld: 134 mg/dL — ABNORMAL HIGH (ref 70–99)
Phosphorus: 3.8 mg/dL (ref 2.5–4.6)
Phosphorus: 3.5 mg/dL (ref 2.5–4.6)
Potassium: 2.8 mmol/L — ABNORMAL LOW (ref 3.5–5.1)
Potassium: 3.9 mmol/L (ref 3.5–5.1)
Sodium: 141 mmol/L (ref 135–145)
Sodium: 141 mmol/L (ref 135–145)

## 2019-11-19 LAB — GLUCOSE, CAPILLARY
Glucose-Capillary: 107 mg/dL — ABNORMAL HIGH (ref 70–99)
Glucose-Capillary: 114 mg/dL — ABNORMAL HIGH (ref 70–99)
Glucose-Capillary: 122 mg/dL — ABNORMAL HIGH (ref 70–99)
Glucose-Capillary: 134 mg/dL — ABNORMAL HIGH (ref 70–99)
Glucose-Capillary: 139 mg/dL — ABNORMAL HIGH (ref 70–99)
Glucose-Capillary: 146 mg/dL — ABNORMAL HIGH (ref 70–99)

## 2019-11-19 LAB — TRIGLYCERIDES: Triglycerides: 243 mg/dL — ABNORMAL HIGH (ref <150)

## 2019-11-19 LAB — MAGNESIUM: Magnesium: 2.2 mg/dL (ref 1.7–2.4)

## 2019-11-19 LAB — APTT: aPTT: 42 seconds — ABNORMAL HIGH (ref 24–36)

## 2019-11-19 MED ORDER — ARTIFICIAL TEARS OPHTHALMIC OINT
1.0000 "application " | TOPICAL_OINTMENT | Freq: Three times a day (TID) | OPHTHALMIC | Status: DC
  Administered 2019-11-19 – 2019-11-23 (×12): 1 via OPHTHALMIC
  Filled 2019-11-19: qty 3.5

## 2019-11-19 MED ORDER — CLONAZEPAM 1 MG PO TABS
1.0000 mg | ORAL_TABLET | Freq: Two times a day (BID) | ORAL | Status: DC
  Administered 2019-11-19 (×2): 1 mg via ORAL
  Filled 2019-11-19 (×2): qty 1

## 2019-11-19 MED ORDER — HEPARIN SODIUM (PORCINE) 1000 UNIT/ML DIALYSIS
1000.0000 [IU] | INTRAMUSCULAR | Status: DC | PRN
  Filled 2019-11-19: qty 6

## 2019-11-19 MED ORDER — PRISMASOL BGK 4/2.5 32-4-2.5 MEQ/L REPLACEMENT SOLN
Status: DC
  Filled 2019-11-19 (×8): qty 5000

## 2019-11-19 MED ORDER — VECURONIUM BROMIDE 10 MG IV SOLR
0.0800 mg/kg | INTRAVENOUS | Status: DC | PRN
  Administered 2019-11-19 – 2019-11-23 (×17): 8 mg via INTRAVENOUS
  Filled 2019-11-19 (×18): qty 10

## 2019-11-19 MED ORDER — OXYCODONE HCL 5 MG PO TABS
5.0000 mg | ORAL_TABLET | Freq: Four times a day (QID) | ORAL | Status: DC
  Administered 2019-11-20 – 2019-11-23 (×15): 5 mg
  Filled 2019-11-19 (×15): qty 1

## 2019-11-19 MED ORDER — CLONAZEPAM 1 MG PO TABS: 1.0000 mg | ORAL_TABLET | Freq: Two times a day (BID) | ORAL | Status: DC

## 2019-11-19 MED ORDER — OXYCODONE HCL 5 MG PO TABS
5.0000 mg | ORAL_TABLET | Freq: Four times a day (QID) | ORAL | Status: DC
  Administered 2019-11-19 (×3): 5 mg via ORAL
  Filled 2019-11-19 (×3): qty 1

## 2019-11-19 MED ORDER — VECURONIUM BROMIDE 10 MG IV SOLR
INTRAVENOUS | Status: AC
  Filled 2019-11-19: qty 10

## 2019-11-19 MED ORDER — POTASSIUM CHLORIDE 20 MEQ/15ML (10%) PO SOLN
40.0000 meq | ORAL | Status: AC
  Administered 2019-11-19: 40 meq via ORAL
  Filled 2019-11-19: qty 30

## 2019-11-19 NOTE — Progress Notes (Signed)
RT came to patient room due to patient's sats of the 50s.  Patient's hands noted to be cold.  Attempted to change pulse ox location however sats only improved to 70s when would read.  Obtained ABG to verify.  Results given to MD.  Increased FIO2 to 100%.  Will continue to monitor.    Ref. Range 11/19/2019 08:21  Sample type Unknown ARTERIAL  pH, Arterial Latest Ref Range: 7.35 - 7.45  7.342 (L)  pCO2 arterial Latest Ref Range: 32 - 48 mmHg 52.0 (H)  pO2, Arterial Latest Ref Range: 83 - 108 mmHg 58 (L)  TCO2 Latest Ref Range: 22 - 32 mmol/L 30  Acid-Base Excess Latest Ref Range: 0.0 - 2.0 mmol/L 2.0  Bicarbonate Latest Ref Range: 20.0 - 28.0 mmol/L 28.2 (H)  O2 Saturation Latest Units: % 88.0

## 2019-11-19 NOTE — Progress Notes (Signed)
NAME:  Rachael Harvey, MRN:  [id number], DOB:  07/10/1980, LOS: 3 ADMISSION DATE:  12-Dec-2019,   CHIEF COMPLAINT:  Shortness of breath  Brief History   39 year old female was transferred from outside hospital with acute hypoxic/hypercapnic respiratory failure, ARDS and acute kidney injury  History of present illness   39 year old female with history of anxiety and depression who was transferred from [facility] for worsening respiratory failure. Patient is intubated so most of the history is taken from patient's chart, as per chart patient was not feeling well for last few days with a cough, shortness of breath and fever.  When EMS was called patient was noted to be hypoxic into the 60s, she was put on high flow and was transferred to [facility] emergency department, where she was put on BiPAP but she continued to remain hypoxic so decision was made to intubate, she was admitted in the hospital with acute hypoxic respiratory failure due to multifocal pneumonia. Over the last 2 days she has been getting IV antibiotics, but her condition continued to deteriorate with hypoxia, hypercapnia and acute metabolic acidosis associated with acute kidney injury so she was transferred to [place] for further management.  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 Patient has history of thrombocytopenia, not leukemia.  She has recent hx of positive UDS for amphetamines and opioids.   Objective   Blood pressure 96/80, pulse 80, temperature 97.9 F (36.6 C), resp. rate (!) 28, height 5\' 10"  (1.778 m), weight 100.1 kg, SpO2 (!) 73 %.    Vent Mode: PRVC FiO2 (%):  [50 %-100 %] 60 % Set Rate:  [32 bmp-35 bmp] 32 bmp Vt Set:  [480 mL] 480 mL PEEP:  [8 cmH20] 8 cmH20 Plateau Pressure:  [28 cmH20-39 cmH20] 39 cmH20   Intake/Output Summary (Last 24 hours) at 11/19/2019 0653 Last data filed at 11/19/2019 0600 Gross per 24 hour  Intake 3388.34 ml  Output 2256 ml  Net 1132.34 ml   Filed Weights   12-Dec-2019 1608 11/18/19 0500 11/19/19 0500  Weight: 97 kg 98 kg 100.1 kg    Examination: General: critically ill appearing female, sedated HENT: ETT, trachea midline Lungs: diffuse bilateral crackles , no wheezing  Cardiovascular: RRR. No LE edema. Abdomen: soft. bs active Extremities: warm, dry Neuro: sedated, pupils equal and reactive Skin: No rash  Noted labs: Cr 2.14 Bun 87, K 2.8, WBC 10, Hgb 11.7, TG 243 Resolved Hospital Problem list     Assessment & Plan:  Acute hypoxic/hypercapnic respiratory failure due to severe ARDS> multifocal pneumonia requiring mechanical ventilation.  AGMA Resp acidosis improving. WBC downtrending. Still requiring high amount of sedation.  FIO2 60,RR 32 Sedated on Fentanyl , Versed TG trending down since stopping propofol  Plan: Continue MV with pressure control per ARDs net protocol Continue Seroquel 100 BID.  Wean fentanyl and Versed as able   Septic shockdue to pneumococcal pneumonia and pneumococcal bacteremia Leukopenia/thrombocytopenia 2/2 above Blood cultures at [facility]  show pan sensitive strep pneumo.  Down from 11 to of levo. Afebrile overnight, abx deescalated to cefazolin 7/8.  Echo showed mild basal hypokinesis and mitral stenosis with trivial regurg. EF 55/60% Plan: Continue levo. Titrate to MAP>65 Continue cefazolin Can consider TEE at some  point if further concern for endocarditis.   Acute kidney injury.  Hypokalemia AGMA Likely from ischemic ATN 2/2 to septic shock as above. CRRT startted 7/9, urine output improving.  K 2.8   Plan: Greatly appreciate nephrology consult and recommendations  Continue to monitor renal function and UOP closely CRRT per nephrology  Bipolar Disorder Patient is on Seroquel 150 qhs at home.  Plan: - Seroquel 100 mg BID - Sertraline 200 mg   Substance use disorder.  History of polysubstance use disorder. Plan:  Will continue to monitor.    Best practice:  Diet: TF Pain/Anxiety/Delirium protocol (if indicated): N/A VAP protocol (if indicated): Y DVT prophylaxis: Subcu heparin GI prophylaxis: Famotidine Glucose control: Sliding scale insulin Mobility: Bedrest Code Status: Full code Family Communication: Will update mother Disposition: ICU  Labs   CBC: Recent Labs  Lab Dec 07, 2019 1940 12-07-19 2324 11/17/19 0257 11/17/19 0356 11/18/19 0519 11/18/19 0520 11/19/19 0425  WBC 18.2*  --  22.1*  --  26.3*  --  10.1  NEUTROABS 16.2*  --   --   --   --   --   --   HGB 11.8*   < > 11.7* 11.6* 11.6* 11.6* 11.7*  HCT 37.4   < > 35.9* 34.0* 35.9* 34.0* 35.0*  MCV 89.9  --  88.6  --  89.1  --  87.3  PLT 99*  --  105*  --  85*  --  49*   < > = values in this interval not displayed.    Basic Metabolic Panel: Recent Labs  Lab December 07, 2019 1940 07-Dec-2019 2324 11/17/19 0257 11/17/19 0356 11/17/19 1142 11/17/19 1610 11/18/19 0519 11/18/19 0520 11/18/19 1700 11/19/19 0425  NA 137   < > 137   < > 139  --  137 138 141 141  K 4.0   < > 5.0   < > 3.8  --  3.1* 3.2* 3.2* 2.8*  CL 107   < > 107  --  109  --  103  --  106 104  CO2 19*   < > 16*  --  15*  --  18*  --  21* 25  GLUCOSE 127*   < > 132*  --  134*  --  174*  --  111* 125*  BUN 87*   < > 94*  --  104*  --  126*  --  132* 87*  CREATININE 4.04*   < > 4.39*  --  4.51*  --  4.41*  --  3.87* 2.14*  CALCIUM 8.0*   < > 6.3*  --  8.6*   --  8.5*  --  8.3* 8.0*  MG 2.5*  --   --   --   --  2.6* 2.5*  --  2.5* 2.2  PHOS  --   --   --   --  6.6*  --  6.8*  --  6.6* 3.8   < > = values in this interval not displayed.   GFR: Estimated Creatinine Clearance: 45.2 mL/min (A) (by C-G formula based on SCr of 2.14 mg/dL (H)). Recent Labs  Lab Dec 07, 2019 1940 2019/12/07 2320 11/17/19 0257 11/18/19 0519 11/19/19 0425  PROCALCITON  --   --  46.19  --   --   WBC 18.2*  --  22.1* 26.3* 10.1  LATICACIDVEN 1.1 1.3  --   --   --     Liver Function Tests: Recent Labs  Lab 11/11/2019 1940 11/17/19 1142 11/18/19 0519 11/18/19 1700 11/19/19 0425  AST 75*  --   --   --   --   ALT 53*  --   --   --   --   ALKPHOS 69  --   --   --   --   BILITOT 1.3*  --   --   --   --   PROT 5.2*  --   --   --   --   ALBUMIN 1.9* 1.9* 1.8* 1.7* 1.7*   No results for input(s): LIPASE, AMYLASE in the last 168 hours. No results for input(s): AMMONIA in the last 168 hours.  ABG    Component Value Date/Time   PHART 7.229 (L) 11/18/2019 0520   PCO2ART 50.5 (H) 11/18/2019 0520   PO2ART 112 (H) 11/18/2019 0520   HCO3 21.0 11/18/2019 0520   TCO2 23 11/18/2019 0520   ACIDBASEDEF 7.0 (H) 11/18/2019 0520   O2SAT 97.0 11/18/2019 0520     Coagulation Profile: Recent Labs  Lab 11/17/2019 1940 11/17/19 0257 11/18/19 1300  INR 1.3* 1.3* 1.2    Cardiac Enzymes: No results for input(s): CKTOTAL, CKMB, CKMBINDEX, TROPONINI in the last 168 hours.  HbA1C: Hgb A1c MFr Bld  Date/Time Value Ref Range Status  12/10/2019 07:40 PM 5.7 (H) 4.8 - 5.6 % Final    Comment:    (NOTE) Pre diabetes:          5.7%-6.4%  Diabetes:              >6.4%  Glycemic control for   <7.0% adults with diabetes     CBG: Recent Labs  Lab 11/18/19 1341 11/18/19 1521 11/18/19 1947 11/18/19 2346 11/19/19 0416  GLUCAP 124* 114* 93 142* 107*    Milus Banister, MD Internal Medicine Resident PGY-2 11/19/19 6:53 AM

## 2019-11-19 NOTE — Progress Notes (Signed)
Patient ID: Rachael Harvey, female   DOB: 1980/11/20, 39 y.o.   MRN: [id number] S: Having difficulty ventilating pt overnight/this am.  Remains on CRRT O:BP (!) 70/31   Pulse (!) 119   Temp 97.7 F (36.5 C)   Resp (!) 25   Ht _0  (1.778 m)   Wt 100.1 kg   SpO2 (!) 87%   BMI 31.66 kg/m   Intake/Output Summary (Last 24 hours) at 11/19/2019 1045 Last data filed at 11/19/2019 1000 Gross per 24 hour  Intake 2956.94 ml  Output 2378 ml  Net 578.94 ml   Intake/Output: I/O last 3 completed shifts: In: 6443.9 [I.V.:4013.8; NG/GT:2130; IV Piggyback:300.1] Out: 2703 [QMGQQ:7619; Other:218]  Intake/Output this shift:  Total I/O In: 494.4 [I.V.:129.4; NG/GT:265; IV Piggyback:100] Out: 250 [Urine:150; Other:100] Weight change: 2.1 kg Gen: intubated and sedated CVS: tachy at 119 Resp: scattered rhonchi Abd: +BS, soft, ND/ND Ext: no edema  Recent Labs  Lab 11/21/2019 1940 11/17/2019 2324 11/17/19 0257 11/17/19 0356 11/17/19 1142 11/18/19 0519 11/18/19 0520 11/18/19 1700 11/19/19 0419 11/19/19 0425 11/19/19 0821  NA 137   < > 137   < > 139 137 138 141 142 141 142  K 4.0   < > 5.0   < > 3.8 3.1* 3.2* 3.2* 2.9* 2.8* 2.9*  CL 107  --  107  --  109 103  --  106  --  104  --   CO2 19*  --  16*  --  15* 18*  --  21*  --  25  --   GLUCOSE 127*  --  132*  --  134* 174*  --  111*  --  125*  --   BUN 87*  --  94*  --  104* 126*  --  132*  --  87*  --   CREATININE 4.04*  --  4.39*  --  4.51* 4.41*  --  3.87*  --  2.14*  --   ALBUMIN 1.9*  --   --   --  1.9* 1.8*  --  1.7*  --  1.7*  --   CALCIUM 8.0*  --  6.3*  --  8.6* 8.5*  --  8.3*  --  8.0*  --   PHOS  --   --   --   --  6.6* 6.8*  --  6.6*  --  3.8  --   AST 75*  --   --   --   --   --   --   --   --   --   --   ALT 53*  --   --   --   --   --   --   --   --   --   --    < > = values in this interval not displayed.   Liver Function Tests: Recent Labs  Lab 11/13/2019 1940 11/17/19 1142 11/18/19 0519 11/18/19 1700 11/19/19 0425   AST 75*  --   --   --   --   ALT 53*  --   --   --   --   ALKPHOS 69  --   --   --   --   BILITOT 1.3*  --   --   --   --   PROT 5.2*  --   --   --   --   ALBUMIN 1.9*   < > 1.8* 1.7* 1.7*   < > = values  in this interval not displayed.   No results for input(s): LIPASE, AMYLASE in the last 168 hours. No results for input(s): AMMONIA in the last 168 hours. CBC: Recent Labs  Lab 11/29/2019 1940 11/17/2019 2324 11/17/19 0257 11/17/19 0356 11/18/19 0519 11/18/19 0520 11/19/19 0419 11/19/19 0425 11/19/19 0821  WBC 18.2*   < > 22.1*  --  26.3*  --   --  10.1  --   NEUTROABS 16.2*  --   --   --   --   --   --   --   --   HGB 11.8*   < > 11.7*   < > 11.6*   < > 11.9* 11.7* 12.6  HCT 37.4   < > 35.9*   < > 35.9*   < > 35.0* 35.0* 37.0  MCV 89.9  --  88.6  --  89.1  --   --  87.3  --   PLT 99*   < > 105*  --  85*  --   --  49*  --    < > = values in this interval not displayed.   Cardiac Enzymes: No results for input(s): CKTOTAL, CKMB, CKMBINDEX, TROPONINI in the last 168 hours. CBG: Recent Labs  Lab 11/18/19 1521 11/18/19 1947 11/18/19 2346 11/19/19 0416 11/19/19 0723  GLUCAP 114* 93 142* 107* 114*    Iron Studies: No results for input(s): IRON, TIBC, TRANSFERRIN, FERRITIN in the last 72 hours. Studies/Results: DG CHEST PORT 1 VIEW  Result Date: 11/19/2019 CLINICAL DATA:  Hypoxia EXAM: PORTABLE CHEST 1 VIEW COMPARISON:  November 18, 2019 FINDINGS: Endotracheal tube tip is 4.2 cm above the carina. Nasogastric tube tip and side port are below the diaphragm. Central catheter tip is in the superior vena cava. No pneumothorax. Airspace opacity is seen throughout the lungs bilaterally, similar to 1 day prior. Heart size and pulmonary vascularity within normal limits. No adenopathy. No bone lesions. IMPRESSION: Tube and catheter positions as described without pneumothorax. Widespread airspace opacity bilaterally, most likely representing multifocal pneumonia. Heart size normal. Overall  appearance similar to 1 day prior. Electronically Signed   By: Lowella Grip III M.D.   On: 11/19/2019 08:04   DG CHEST PORT 1 VIEW  Result Date: 11/18/2019 CLINICAL DATA:  Hypoxemia EXAM: PORTABLE CHEST 1 VIEW COMPARISON:  November 16, 2019 FINDINGS: Endotracheal tube, nasogastric tube at right central venous line are unchanged. The mediastinal contour and cardiac silhouette are stable. Patchy consolidation are identified throughout bilateral lungs unchanged. There is no pleural effusion. There is no pneumothorax. IMPRESSION: Bilateral pneumonias unchanged. Electronically Signed   By: Abelardo Diesel M.D.   On: 11/18/2019 12:31   ECHOCARDIOGRAM COMPLETE  Result Date: 11/18/2019    ECHOCARDIOGRAM REPORT   Patient Name:   Rachael Harvey Date of Exam: 11/18/2019 Medical Rec #:  [id number]     Height:       70.0 in Accession #:    [id number]    Weight:       216.0 lb Date of Birth:  05-18-1980    BSA:          2.157 m Patient Age:    39 years      BP:           109/65 mmHg Patient Gender: F             HR:           76 bpm. Exam Location:  Inpatient Procedure: 2D Echo Indications:  Bacteremia 790.7 / R78.81  History:        Patient has no prior history of Echocardiogram examinations.                 Acute hypoxic/hypercapnic respiratory failure, Septic shock with                 endorgan damage due to pneumococcal pneumonia.  Sonographer:    Darlina Sicilian RDCS Referring Phys: [id number] [place] CHAND  Sonographer Comments: Echo performed with patient supine and on artificial respirator. IMPRESSIONS  1. Left ventricular ejection fraction, by estimation, is 55 to 60% with mild basal inferior hypokinesis. The left ventricle has normal function.  2. Right ventricular systolic function is normal. The right ventricular size is normal. There is normal pulmonary artery systolic pressure.  3. The mitral valve is abnormal. Trivial mitral valve regurgitation.  4. The aortic valve is normal in structure. Aortic valve regurgitation  is not visualized.  5. The inferior vena cava is normal in size with greater than 50% respiratory variability, suggesting right atrial pressure of 3 mmHg. FINDINGS  Left Ventricle: Left ventricular ejection fraction, by estimation, is 55 to 60% with mild basal hypokinesis . The left ventricle has normal function. The left ventricle demonstrates regional wall motion abnormalities. The left ventricular internal cavity size was normal in size. There is no left ventricular hypertrophy. Left ventricular diastolic parameters were normal. Right Ventricle: The right ventricular size is normal. Right vetricular wall thickness was not assessed. Right ventricular systolic function is normal. There is normal pulmonary artery systolic pressure. The tricuspid regurgitant velocity is 2.44 m/s, and with an assumed right atrial pressure of 8 mmHg, the estimated right ventricular systolic pressure is 96.7 mmHg. Left Atrium: Left atrial size was normal in size. Right Atrium: Right atrial size was normal in size. Pericardium: There is no evidence of pericardial effusion. Mitral Valve: The mitral valve is abnormal. There is mild thickening of the mitral valve leaflet(s). Trivial mitral valve regurgitation. Tricuspid Valve: The tricuspid valve is normal in structure. Tricuspid valve regurgitation is mild. Aortic Valve: The aortic valve is normal in structure. Aortic valve regurgitation is not visualized. Pulmonic Valve: The pulmonic valve was grossly normal. Pulmonic valve regurgitation is not visualized. Aorta: The aortic root is normal in size and structure. Venous: The inferior vena cava is normal in size with greater than 50% respiratory variability, suggesting right atrial pressure of 3 mmHg. IAS/Shunts: No atrial level shunt detected by color flow Doppler.  LEFT VENTRICLE PLAX 2D LVIDd:         4.40 cm  Diastology LVIDs:         3.00 cm  LV e' lateral:   11.70 cm/s LV PW:         0.80 cm  LV E/e' lateral: 9.2 LV IVS:        0.90 cm   LV e' medial:    11.40 cm/s LVOT diam:     1.90 cm  LV E/e' medial:  9.5 LV SV:         65 LV SV Index:   30 LVOT Area:     2.84 cm  RIGHT VENTRICLE TAPSE (M-mode): 2.4 cm LEFT ATRIUM             Index       RIGHT ATRIUM           Index LA diam:        3.30 cm 1.53 cm/m  RA Area:  13.70 cm LA Vol (A2C):   54.1 ml 25.06 ml/m RA Volume:   34.40 ml  15.95 ml/m LA Vol (A4C):   47.6 ml 22.07 ml/m LA Biplane Vol: 47.9 ml 22.21 ml/m  AORTIC VALVE LVOT Vmax:   131.00 cm/s LVOT Vmean:  80.300 cm/s LVOT VTI:    0.230 m  AORTA Ao Root diam: 2.80 cm MITRAL VALVE                TRICUSPID VALVE MV Area (PHT): 4.29 cm     TR Peak grad:   23.8 mmHg MV Decel Time: 177 msec     TR Vmax:        244.00 cm/s MV E velocity: 108.00 cm/s MV A velocity: 53.10 cm/s   SHUNTS MV E/A ratio:  2.03         Systemic VTI:  0.23 m                             Systemic Diam: 1.90 cm Dorris Carnes MD Electronically signed by Dorris Carnes MD Signature Date/Time: 11/18/2019/6:30:51 PM    Final    . artificial tears  1 application Both Eyes J8S  . chlorhexidine gluconate (MEDLINE KIT)  15 mL Mouth Rinse BID  . Chlorhexidine Gluconate Cloth  6 each Topical Daily  . clonazePAM  1 mg Oral BID  . docusate  100 mg Per Tube BID  . feeding supplement (PROSource TF)  45 mL Per Tube BID  . heparin  5,000 Units Subcutaneous Q8H  . insulin aspart  0-9 Units Subcutaneous Q4H  . mouth rinse  15 mL Mouth Rinse 10 times per day  . oxyCODONE  5 mg Oral Q6H  . pantoprazole (PROTONIX) IV  40 mg Intravenous Q24H  . polyethylene glycol  17 g Per Tube Daily  . QUEtiapine  100 mg Per Tube BID  . sertraline  200 mg Per Tube Daily  . sodium chloride flush  10-40 mL Intracatheter Q12H  . vecuronium        BMET    Component Value Date/Time   NA 142 11/19/2019 0821   K 2.9 (L) 11/19/2019 0821   CL 104 11/19/2019 0425   CO2 25 11/19/2019 0425   GLUCOSE 125 (H) 11/19/2019 0425   BUN 87 (H) 11/19/2019 0425   CREATININE 2.14 (H) 11/19/2019 0425    CALCIUM 8.0 (L) 11/19/2019 0425   GFRNONAA 28 (L) 11/19/2019 0425   GFRAA 33 (L) 11/19/2019 0425   CBC    Component Value Date/Time   WBC 10.1 11/19/2019 0425   RBC 4.01 11/19/2019 0425   HGB 12.6 11/19/2019 0821   HCT 37.0 11/19/2019 0821   PLT 49 (L) 11/19/2019 0425   MCV 87.3 11/19/2019 0425   MCH 29.2 11/19/2019 0425   MCHC 33.4 11/19/2019 0425   RDW 16.8 (H) 11/19/2019 0425   LYMPHSABS 0.5 (L) 11/30/2019 1940   MONOABS 0.5 11/22/2019 1940   EOSABS 0.0 11/12/2019 1940   BASOSABS 0.0 11/19/2019 1940     Assessment/Plan: 1. AKI- presumably ischemic ATN in setting of severe septic shock due to pneumoncoccal pneumonia. Further complicated by metabolic acidosis and hyperkalemia.  1. Initiated CRRT 11/18/19 given ongoing sepsis and metabolic acidosis  2. Will change post filter fluids to 4K/2.5Ca due to hypokalemia and resolution of acidosis.  2. VDRF- due to multifocal pneumonia complicated by ARDS- per PCCM 3. Septic shock due to pneumococcal pneumonia with bacteremia. Currently on  10 mcg of levo. 4. Metabolic acidosis- due to #1. improved with CRRT and iv bicarb.  Will stop bicarb and follow 5. Hypocalcemia- repleted with IV calcium gluconate (given before starting bicarb) 6. Hyperkalemia- due to #1. improved with treatment. 7. Hypokalemia- will change to 4K bath on all fluids and replete IV prn.  Donetta Potts, MD [employer] [phone]

## 2019-11-19 NOTE — Progress Notes (Signed)
RT to patient room due to patient having a decrease in sats to 82% and maintaining with good waveform.  Attempted to lavage patient.  Was able to obtain a small amount of thick, tan secretions , with which a sample was sent down to main lab.  Sats only improved to 85%.  Attempted to bag lavage patient with a PEEP of 10 but sats only improved to 88%.  Placed patient back on ventilator and increased PEEP from 8 to 10 per ARDS protocol.  Will continue to monitor.

## 2019-11-19 NOTE — Progress Notes (Signed)
NAME:  Rachael Harvey, MRN:  [id number], DOB:  [birth year], LOS: 3 ADMISSION DATE:  02-Dec-2019,   CHIEF COMPLAINT:  Shortness of breath  Brief History   39 year old female was transferred from outside hospital with acute hypoxic/hypercapnic respiratory failure, ARDS and acute kidney injury  History of present illness   39 year old female with history of anxiety and depression who was transferred from [facility] for worsening respiratory failure. Patient is intubated so most of the history is taken from patient's chart, as per chart patient was not feeling well for last few days with a cough, shortness of breath and fever.  When EMS was called patient was noted to be hypoxic into the 60s, she was put on high flow and was transferred to [facility] emergency department, where she was put on BiPAP but she continued to remain hypoxic so decision was made to intubate, she was admitted in the hospital with acute hypoxic respiratory failure due to multifocal pneumonia. Over the last 2 days she has been getting IV antibiotics, but her condition continued to deteriorate with hypoxia, hypercapnia and acute metabolic acidosis associated with acute kidney injury so she was transferred to [facility] for further management.  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 Remains sedated on vent.  Objective   Blood pressure (!) 103/43, pulse 80, temperature 97.7 F  (36.5 C), resp. rate (!) 25, height 5\' 10"  (1.778 m), weight 100.1 kg, SpO2 (!) 73 %.    Vent Mode: PRVC FiO2 (%):  [55 %-100 %] 60 % Set Rate:  [32 bmp] 32 bmp Vt Set:  [480 mL] 480 mL PEEP:  [8 cmH20] 8 cmH20 Plateau Pressure:  [39 cmH20] 39 cmH20   Intake/Output Summary (Last 24 hours) at 11/19/2019 Last data filed at 11/19/2019 0700 Gross per 24 hour  Intake 3488.93 ml  Output 2303 ml  Net 1185.93 ml   Filed Weights   12/02/2019 1608 11/18/19 0500 11/19/19 0500  Weight: 97 kg 98 kg 100.1 kg    Examination: GEN: ill appearing woman on vent HEENT: ETT in place with moderate thick secretions CV: RRR, ext warm PULM: severe rhonci bilaterally, +accessory muscle use GI: Soft, +BS EXT: 1+ edema NEURO: flails all 4 ext per nursing with sedation wean PSYCH: RASS -3 SKIN: Appears jaundiced  CBG ok Low K on CMP Worsening thrombocytopenia CXR severe bilateral infiltrates with suggestion of cavitation  Resolved Hospital Problem list     Assessment & Plan:  Severe ARDS and septic shock with multiorgan injury secondary to pneumococcal pneumonia/bacteremia.  No obvious vegetation on TTE.  Will order TEE Monday but strep pneumonia endocarditis is pretty rare. - Usual VAP prevention bundle and ARDS protocol PEEP/FiO2 wean - Sedation titrated vent synchrony - Limit driving pressures as able - Cefazolin with anticipated prolonged course given severity of illness - Today: add clonazepam and standing oxycodone, work on titrating down drips; draw blood cultures to show clearance  Thrombocytopenia- worsening, low 4T score, trend for now  Bipolar Disorder Patient  is on Seroquel 150 qhs at home. Will adjust dosing to help with sedation as mentioned above.  - Seroquel 100 mg BID - Sertraline 200 mg   Substance use disorder.  History of polysubstance use disorder.  Makes sedation difficult.   Best practice:  Diet: TF Pain/Anxiety/Delirium protocol (if indicated): N/A VAP  protocol (if indicated): Y DVT prophylaxis: Subcu heparin GI prophylaxis: Famotidine Glucose control: Sliding scale insulin Mobility: Bedrest Code Status: Full code Family Communication: mother to be updated by resident physician Disposition: ICU   The patient is critically ill with multiple organ systems failure and requires high complexity decision making for assessment and support, frequent evaluation and titration of therapies, application of advanced monitoring technologies and extensive interpretation of multiple databases. Critical Care Time devoted to patient care services described in this note independent of APP/resident time (if applicable)  is 42 minutes.   Myrla Halsted MD [facility] 11/19/2019 8:12 AM Personal pager: [phone] If unanswered, please page CCM On-call: #[phone]'

## 2019-11-20 LAB — RENAL FUNCTION PANEL
Albumin: 1.6 g/dL — ABNORMAL LOW (ref 3.5–5.0)
Albumin: 2 g/dL — ABNORMAL LOW (ref 3.5–5.0)
Anion gap: 10 (ref 5–15)
Anion gap: 9 (ref 5–15)
BUN: 51 mg/dL — ABNORMAL HIGH (ref 6–20)
BUN: 41 mg/dL — ABNORMAL HIGH (ref 6–20)
CO2: 24 mmol/L (ref 22–32)
CO2: 25 mmol/L (ref 22–32)
Calcium: 8.3 mg/dL — ABNORMAL LOW (ref 8.9–10.3)
Calcium: 8.4 mg/dL — ABNORMAL LOW (ref 8.9–10.3)
Chloride: 105 mmol/L (ref 98–111)
Chloride: 104 mmol/L (ref 98–111)
Creatinine, Ser: 1.15 mg/dL — ABNORMAL HIGH (ref 0.44–1.00)
Creatinine, Ser: 1 mg/dL (ref 0.44–1.00)
GFR calc Af Amer: >60 mL/min (ref >60)
GFR calc Af Amer: >60 mL/min (ref >60)
GFR calc non Af Amer: 60 mL/min — ABNORMAL LOW (ref >60)
GFR calc non Af Amer: >60 mL/min (ref >60)
Glucose, Bld: 151 mg/dL — ABNORMAL HIGH (ref 70–99)
Glucose, Bld: 142 mg/dL — ABNORMAL HIGH (ref 70–99)
Phosphorus: 2.6 mg/dL (ref 2.5–4.6)
Phosphorus: 3.1 mg/dL (ref 2.5–4.6)
Potassium: 4 mmol/L (ref 3.5–5.1)
Potassium: 3.9 mmol/L (ref 3.5–5.1)
Sodium: 139 mmol/L (ref 135–145)
Sodium: 138 mmol/L (ref 135–145)

## 2019-11-20 LAB — GLUCOSE, CAPILLARY
Glucose-Capillary: 124 mg/dL — ABNORMAL HIGH (ref 70–99)
Glucose-Capillary: 126 mg/dL — ABNORMAL HIGH (ref 70–99)
Glucose-Capillary: 127 mg/dL — ABNORMAL HIGH (ref 70–99)
Glucose-Capillary: 129 mg/dL — ABNORMAL HIGH (ref 70–99)
Glucose-Capillary: 160 mg/dL — ABNORMAL HIGH (ref 70–99)
Glucose-Capillary: 170 mg/dL — ABNORMAL HIGH (ref 70–99)

## 2019-11-20 LAB — TRIGLYCERIDES: Triglycerides: 184 mg/dL — ABNORMAL HIGH (ref <150)

## 2019-11-20 LAB — POCT I-STAT 7, (LYTES, BLD GAS, ICA,H+H)
Acid-Base Excess: 1 mmol/L (ref 0.0–2.0)
Bicarbonate: 26.8 mmol/L (ref 20.0–28.0)
Calcium, Ion: 1.25 mmol/L (ref 1.15–1.40)
HCT: 30 % — ABNORMAL LOW (ref 36.0–46.0)
Hemoglobin: 10.2 g/dL — ABNORMAL LOW (ref 12.0–15.0)
O2 Saturation: 92 %
Patient temperature: 99.7
Potassium: 4 mmol/L (ref 3.5–5.1)
Sodium: 139 mmol/L (ref 135–145)
TCO2: 28 mmol/L (ref 22–32)
pCO2 arterial: 50.9 mmHg — ABNORMAL HIGH (ref 32.0–48.0)
pH, Arterial: 7.332 — ABNORMAL LOW (ref 7.350–7.450)
pO2, Arterial: 72 mmHg — ABNORMAL LOW (ref 83.0–108.0)

## 2019-11-20 LAB — CBC
HCT: 32.9 % — ABNORMAL LOW (ref 36.0–46.0)
Hemoglobin: 10.6 g/dL — ABNORMAL LOW (ref 12.0–15.0)
MCH: 28.5 pg (ref 26.0–34.0)
MCHC: 32.2 g/dL (ref 30.0–36.0)
MCV: 88.4 fL (ref 80.0–100.0)
Platelets: 67 10*3/uL — ABNORMAL LOW (ref 150–400)
RBC: 3.72 MIL/uL — ABNORMAL LOW (ref 3.87–5.11)
RDW: 16.6 % — ABNORMAL HIGH (ref 11.5–15.5)
WBC: 14.4 10*3/uL — ABNORMAL HIGH (ref 4.0–10.5)
nRBC: 0.2 % (ref 0.0–0.2)

## 2019-11-20 LAB — POCT ACTIVATED CLOTTING TIME
Activated Clotting Time: 213 seconds
Activated Clotting Time: 213 seconds
Activated Clotting Time: 219 seconds
Activated Clotting Time: 219 seconds
Activated Clotting Time: 219 seconds
Activated Clotting Time: 224 seconds

## 2019-11-20 LAB — MAGNESIUM: Magnesium: 2.5 mg/dL — ABNORMAL HIGH (ref 1.7–2.4)

## 2019-11-20 LAB — APTT: aPTT: 99 seconds — ABNORMAL HIGH (ref 24–36)

## 2019-11-20 MED ORDER — MIDODRINE HCL 5 MG PO TABS
10.0000 mg | ORAL_TABLET | Freq: Three times a day (TID) | ORAL | Status: DC
  Administered 2019-11-20 – 2019-11-21 (×3): 10 mg via ORAL
  Filled 2019-11-20 (×3): qty 2

## 2019-11-20 MED ORDER — ALBUMIN HUMAN 25 % IV SOLN
25.0000 g | Freq: Four times a day (QID) | INTRAVENOUS | Status: AC
  Administered 2019-11-20 – 2019-11-21 (×4): 25 g via INTRAVENOUS
  Filled 2019-11-20 (×4): qty 100

## 2019-11-20 MED ORDER — SODIUM PHOSPHATES 45 MMOLE/15ML IV SOLN
20.0000 mmol | Freq: Once | INTRAVENOUS | Status: AC
  Administered 2019-11-20: 20 mmol via INTRAVENOUS
  Filled 2019-11-20: qty 6.67

## 2019-11-20 MED ORDER — CLONAZEPAM 1 MG PO TABS
2.0000 mg | ORAL_TABLET | Freq: Two times a day (BID) | ORAL | Status: DC
  Administered 2019-11-20 – 2019-11-23 (×7): 2 mg
  Filled 2019-11-20 (×7): qty 2

## 2019-11-20 NOTE — Progress Notes (Signed)
RT note- called to room for sp02 86%, fio2 and peep increased and charted. Sp02 now 93%.

## 2019-11-20 NOTE — Progress Notes (Signed)
RT note- attempting PCV at this time due to not synchronized, mechanics and minute ventilation appear improved, will follow up with ABG.

## 2019-11-20 NOTE — Progress Notes (Addendum)
Dr. Katrinka Blazing was made aware that Levophed was increased from 4 mcg/min to 22 mcg/min and urine output was zero. New orders placed for albumin and midodrine.

## 2019-11-20 NOTE — Progress Notes (Addendum)
NAME:  Rachael Harvey, MRN:  [id number], DOB:  Nov 10, 1980, LOS: 4 ADMISSION DATE:  11/21/2019,   CHIEF COMPLAINT:  Shortness of breath  Brief History   39 year old female was transferred from outside hospital with acute hypoxic/hypercapnic respiratory failure, ARDS and acute kidney injury  History of present illness   39 year old female with history of anxiety and depression who was transferred from [facility] for worsening respiratory failure. Patient is intubated so most of the history is taken from patient's chart, as per chart patient was not feeling well for last few days with a cough, shortness of breath and fever.  When EMS was called patient was noted to be hypoxic into the 60s, she was put on high flow and was transferred to [facility] emergency department, where she was put on BiPAP but she continued to remain hypoxic so decision was made to intubate, she was admitted in the hospital with acute hypoxic respiratory failure due to multifocal pneumonia. Over the last 2 days she has been getting IV antibiotics, but her condition continued to deteriorate with hypoxia, hypercapnia and acute metabolic acidosis associated with acute kidney injury so she was transferred to [facility] for further management.  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  Objective   Blood pressure  (!) 107/44, pulse (!) 103, temperature 99.9 F (37.7 C), resp. rate 20, height 5\' 10"  (1.778 m), weight 102.9 kg, SpO2 93 %.    Vent Mode: PRVC FiO2 (%):  [50 %-100 %] 70 % Set Rate:  [32 bmp] 32 bmp Vt Set:  [480 mL] 480 mL PEEP:  [8 cmH20-10 cmH20] 10 cmH20 Plateau Pressure:  [28 cmH20] 28 cmH20   Intake/Output Summary (Last 24 hours) at 11/20/2019 0732 Last data filed at 11/20/2019 0700 Gross per 24 hour  Intake 2907.9 ml  Output 2830 ml  Net 77.9 ml   Filed Weights   11/18/19 0500 11/19/19 0500 11/20/19 0354  Weight: 98 kg 100.1 kg 102.9 kg    Examination: GEN: ill appearing woman on vent HEENT: ETT in place with moderate thick secretions CV: RRR, ext warm PULM: severe rhonci bilaterally, +accessory muscle use GI: Soft, +BS EXT: trace  edema NEURO: flailed ext 7/10 with sedation wean, not weaning sedation today given oxygenation issues PSYCH: RASS -5 SKIN: Appears jaundiced  CBG ok Plts a bit better WBC a bit worse No new CXR  Resolved Hospital Problem list     Assessment & Plan:  Severe ARDS and septic shock with multiorgan injury secondary to pneumococcal pneumonia/bacteremia.  No obvious vegetation on TTE. Persistent severe O2 needs with a great deal of this coming from vent dys-synchrony.  - Consider ordering TEE Monday but strep pneumonia endocarditis is pretty rare. - Usual VAP prevention bundle and ARDS protocol PEEP/FiO2 wean - PRN vecuronium pushes to prevent derecruitment, there is no happy medium with her sedation - f/u blood cultures 7/10 to document clearance - Limit driving pressures as able - Continue  to try to switch off drips to oral sedation meds - Cefazolin with anticipated prolonged course given severity of illness - Today: try to limit NMB pushes, continue CRRT, titrate up clonazepam, check AM CXR, suspect this is going to be a long process  Thrombocytopenia- better, low 4T score, trend for now  Leukocytosis- a bit up today, trend for now,  f/u tracheal aspirate from 7/10  Bipolar Disorder Patient is on Seroquel 150 qhs at home. Will adjust dosing to help with sedation as mentioned above.  - Seroquel 100 mg BID - Sertraline 200 mg   Substance use disorder.  History of polysubstance use disorder.  Makes sedation difficult.   Best practice:  Diet: TF Pain/Anxiety/Delirium protocol (if indicated): N/A VAP protocol (if indicated): Y DVT prophylaxis: Subcu heparin GI prophylaxis: Famotidine Glucose control: Sliding scale insulin Mobility: Bedrest Code Status: Full code Family Communication: pending, lots of issues here Disposition: ICU   The patient is critically ill with multiple organ systems failure and requires high complexity decision making for assessment and support, frequent evaluation and titration of therapies, application of advanced monitoring technologies and extensive interpretation of multiple databases. Critical Care Time devoted to patient care services described in this note independent of APP/resident time (if applicable)  is 32 minutes.   Myrla Halsted MD [facility] 11/20/2019 7:32 AM Personal pager: [phone] If unanswered, please page CCM On-call: #[phone]'

## 2019-11-20 NOTE — Progress Notes (Signed)
Patient ID: Rachael Harvey, female   DOB: 01-Jul-1980, 39 y.o.   MRN: [id number] S: Was very dysynchronous on vent and increased pressor support requirements this am.  Also with significant drop in UOP over the past 24 hours. O:BP (!) 103/45   Pulse 88   Temp 99.3 F (37.4 C)   Resp 16   Ht 5' 10" (1.778 m)   Wt 102.9 kg   SpO2 96%   BMI 32.55 kg/m   Intake/Output Summary (Last 24 hours) at 11/20/2019 1047 Last data filed at 11/20/2019 1000 Gross per 24 hour  Intake 2827.96 ml  Output 3053 ml  Net -225.04 ml   Intake/Output: I/O last 3 completed shifts: In: 4386.7 [I.V.:1701.6; NG/GT:2365; IV Piggyback:320.1] Out: 0867 [YPPJK:9326; Other:2334]  Intake/Output this shift:  Total I/O In: 414.4 [I.V.:129.4; Other:120; NG/GT:165] Out: 473 [Urine:50; Other:423] Weight change: 2.8 kg Gen: intubated and sedated CVS: RRR  Resp: occ rhonchi bilaterally Abd: +BS, soft, NT/ND Ext: no edema  Recent Labs  Lab 12/08/2019 1940 12/03/2019 2324 11/17/19 0257 11/17/19 0356 11/17/19 1142 11/17/19 1142 11/18/19 0519 11/18/19 0520 11/18/19 1700 11/19/19 0419 11/19/19 0425 11/19/19 0821 11/19/19 1636 11/20/19 0358 11/20/19 1015  NA 137   < > 137   < > 139   < > 137   < > 141 142 141 142 141 139 139  K 4.0   < > 5.0   < > 3.8   < > 3.1*   < > 3.2* 2.9* 2.8* 2.9* 3.9 4.0 4.0  CL 107   < > 107  --  109  --  103  --  106  --  104  --  106 105  --   CO2 19*   < > 16*  --  15*  --  18*  --  21*  --  25  --  26 24  --   GLUCOSE 127*   < > 132*  --  134*  --  174*  --  111*  --  125*  --  134* 151*  --   BUN 87*   < > 94*  --  104*  --  126*  --  132*  --  87*  --  62* 51*  --   CREATININE 4.04*   < > 4.39*  --  4.51*  --  4.41*  --  3.87*  --  2.14*  --  1.45* 1.15*  --   ALBUMIN 1.9*  --   --   --  1.9*  --  1.8*  --  1.7*  --  1.7*  --  1.7* 1.6*  --   CALCIUM 8.0*   < > 6.3*  --  8.6*  --  8.5*  --  8.3*  --  8.0*  --  8.2* 8.3*  --   PHOS  --   --   --   --  6.6*  --  6.8*  --  6.6*  --   3.8  --  3.5 2.6  --   AST 75*  --   --   --   --   --   --   --   --   --   --   --   --   --   --   ALT 53*  --   --   --   --   --   --   --   --   --   --   --   --   --   --    < > =  values in this interval not displayed.   Liver Function Tests: Recent Labs  Lab 12/09/2019 1940 11/17/19 1142 11/19/19 0425 11/19/19 1636 11/20/19 0358  AST 75*  --   --   --   --   ALT 53*  --   --   --   --   ALKPHOS 69  --   --   --   --   BILITOT 1.3*  --   --   --   --   PROT 5.2*  --   --   --   --   ALBUMIN 1.9*   < > 1.7* 1.7* 1.6*   < > = values in this interval not displayed.   No results for input(s): LIPASE, AMYLASE in the last 168 hours. No results for input(s): AMMONIA in the last 168 hours. CBC: Recent Labs  Lab 11/20/2019 1940 11/29/2019 2324 11/17/19 0257 11/17/19 0356 11/18/19 0519 11/18/19 0520 11/19/19 0425 11/19/19 0425 11/19/19 0821 11/20/19 0358 11/20/19 1015  WBC 18.2*   < > 22.1*   < > 26.3*  --  10.1  --   --  14.4*  --   NEUTROABS 16.2*  --   --   --   --   --   --   --   --   --   --   HGB 11.8*   < > 11.7*   < > 11.6*   < > 11.7*   < > 12.6 10.6* 10.2*  HCT 37.4   < > 35.9*   < > 35.9*   < > 35.0*   < > 37.0 32.9* 30.0*  MCV 89.9  --  88.6  --  89.1  --  87.3  --   --  88.4  --   PLT 99*   < > 105*   < > 85*  --  49*  --   --  67*  --    < > = values in this interval not displayed.   Cardiac Enzymes: No results for input(s): CKTOTAL, CKMB, CKMBINDEX, TROPONINI in the last 168 hours. CBG: Recent Labs  Lab 11/19/19 1538 11/19/19 1937 11/19/19 2339 11/20/19 0346 11/20/19 0714  GLUCAP 122* 139* 134* 129* 170*    Iron Studies: No results for input(s): IRON, TIBC, TRANSFERRIN, FERRITIN in the last 72 hours. Studies/Results: DG CHEST PORT 1 VIEW  Result Date: 11/19/2019 CLINICAL DATA:  Hypoxia EXAM: PORTABLE CHEST 1 VIEW COMPARISON:  November 18, 2019 FINDINGS: Endotracheal tube tip is 4.2 cm above the carina. Nasogastric tube tip and side port are below the  diaphragm. Central catheter tip is in the superior vena cava. No pneumothorax. Airspace opacity is seen throughout the lungs bilaterally, similar to 1 day prior. Heart size and pulmonary vascularity within normal limits. No adenopathy. No bone lesions. IMPRESSION: Tube and catheter positions as described without pneumothorax. Widespread airspace opacity bilaterally, most likely representing multifocal pneumonia. Heart size normal. Overall appearance similar to 1 day prior. Electronically Signed   By: William  Woodruff III M.D.   On: 11/19/2019 08:04   DG CHEST PORT 1 VIEW  Result Date: 11/18/2019 CLINICAL DATA:  Hypoxemia EXAM: PORTABLE CHEST 1 VIEW COMPARISON:  November 16, 2019 FINDINGS: Endotracheal tube, nasogastric tube at right central venous line are unchanged. The mediastinal contour and cardiac silhouette are stable. Patchy consolidation are identified throughout bilateral lungs unchanged. There is no pleural effusion. There is no pneumothorax. IMPRESSION: Bilateral pneumonias unchanged. Electronically Signed   By: Wei-Chen  Lin   M.D.   On: 11/18/2019 12:31   ECHOCARDIOGRAM COMPLETE  Result Date: 11/18/2019    ECHOCARDIOGRAM REPORT   Patient Name:   Rachael Harvey Date of Exam: 11/18/2019 Medical Rec #:  [id number]     Height:       70.0 in Accession #:    [id number]    Weight:       216.0 lb Date of Birth:  07/24/1980    BSA:          2.157 m Patient Age:    38 years      BP:           109/65 mmHg Patient Gender: F             HR:           76 bpm. Exam Location:  Inpatient Procedure: 2D Echo Indications:    Bacteremia 790.7 / R78.81  History:        Patient has no prior history of Echocardiogram examinations.                 Acute hypoxic/hypercapnic respiratory failure, Septic shock with                 endorgan damage due to pneumococcal pneumonia.  Sonographer:    Tiffany Cooper RDCS Referring Phys: [id number] SUDHAM CHAND  Sonographer Comments: Echo performed with patient supine and on artificial  respirator. IMPRESSIONS  1. Left ventricular ejection fraction, by estimation, is 55 to 60% with mild basal inferior hypokinesis. The left ventricle has normal function.  2. Right ventricular systolic function is normal. The right ventricular size is normal. There is normal pulmonary artery systolic pressure.  3. The mitral valve is abnormal. Trivial mitral valve regurgitation.  4. The aortic valve is normal in structure. Aortic valve regurgitation is not visualized.  5. The inferior vena cava is normal in size with greater than 50% respiratory variability, suggesting right atrial pressure of 3 mmHg. FINDINGS  Left Ventricle: Left ventricular ejection fraction, by estimation, is 55 to 60% with mild basal hypokinesis . The left ventricle has normal function. The left ventricle demonstrates regional wall motion abnormalities. The left ventricular internal cavity size was normal in size. There is no left ventricular hypertrophy. Left ventricular diastolic parameters were normal. Right Ventricle: The right ventricular size is normal. Right vetricular wall thickness was not assessed. Right ventricular systolic function is normal. There is normal pulmonary artery systolic pressure. The tricuspid regurgitant velocity is 2.44 m/s, and with an assumed right atrial pressure of 8 mmHg, the estimated right ventricular systolic pressure is 31.8 mmHg. Left Atrium: Left atrial size was normal in size. Right Atrium: Right atrial size was normal in size. Pericardium: There is no evidence of pericardial effusion. Mitral Valve: The mitral valve is abnormal. There is mild thickening of the mitral valve leaflet(s). Trivial mitral valve regurgitation. Tricuspid Valve: The tricuspid valve is normal in structure. Tricuspid valve regurgitation is mild. Aortic Valve: The aortic valve is normal in structure. Aortic valve regurgitation is not visualized. Pulmonic Valve: The pulmonic valve was grossly normal. Pulmonic valve regurgitation is not  visualized. Aorta: The aortic root is normal in size and structure. Venous: The inferior vena cava is normal in size with greater than 50% respiratory variability, suggesting right atrial pressure of 3 mmHg. IAS/Shunts: No atrial level shunt detected by color flow Doppler.  LEFT VENTRICLE PLAX 2D LVIDd:         4.40 cm  Diastology LVIDs:           3.00 cm  LV e' lateral:   11.70 cm/s LV PW:         0.80 cm  LV E/e' lateral: 9.2 LV IVS:        0.90 cm  LV e' medial:    11.40 cm/s LVOT diam:     1.90 cm  LV E/e' medial:  9.5 LV SV:         65 LV SV Index:   30 LVOT Area:     2.84 cm  RIGHT VENTRICLE TAPSE (M-mode): 2.4 cm LEFT ATRIUM             Index       RIGHT ATRIUM           Index LA diam:        3.30 cm 1.53 cm/m  RA Area:     13.70 cm LA Vol (A2C):   54.1 ml 25.06 ml/m RA Volume:   34.40 ml  15.95 ml/m LA Vol (A4C):   47.6 ml 22.07 ml/m LA Biplane Vol: 47.9 ml 22.21 ml/m  AORTIC VALVE LVOT Vmax:   131.00 cm/s LVOT Vmean:  80.300 cm/s LVOT VTI:    0.230 m  AORTA Ao Root diam: 2.80 cm MITRAL VALVE                TRICUSPID VALVE MV Area (PHT): 4.29 cm     TR Peak grad:   23.8 mmHg MV Decel Time: 177 msec     TR Vmax:        244.00 cm/s MV E velocity: 108.00 cm/s MV A velocity: 53.10 cm/s   SHUNTS MV E/A ratio:  2.03         Systemic VTI:  0.23 m                             Systemic Diam: 1.90 cm Paula Ross MD Electronically signed by Paula Ross MD Signature Date/Time: 11/18/2019/6:30:51 PM    Final    . artificial tears  1 application Both Eyes Q8H  . chlorhexidine gluconate (MEDLINE KIT)  15 mL Mouth Rinse BID  . Chlorhexidine Gluconate Cloth  6 each Topical Daily  . clonazePAM  2 mg Per Tube BID  . docusate  100 mg Per Tube BID  . feeding supplement (PROSource TF)  45 mL Per Tube BID  . heparin  5,000 Units Subcutaneous Q8H  . insulin aspart  0-9 Units Subcutaneous Q4H  . mouth rinse  15 mL Mouth Rinse 10 times per day  . midodrine  10 mg Oral Q8H  . oxyCODONE  5 mg Per Tube Q6H  . pantoprazole  (PROTONIX) IV  40 mg Intravenous Q24H  . polyethylene glycol  17 g Per Tube Daily  . QUEtiapine  100 mg Per Tube BID  . sertraline  200 mg Per Tube Daily  . sodium chloride flush  10-40 mL Intracatheter Q12H    BMET    Component Value Date/Time   NA 139 11/20/2019 1015   K 4.0 11/20/2019 1015   CL 105 11/20/2019 0358   CO2 24 11/20/2019 0358   GLUCOSE 151 (H) 11/20/2019 0358   BUN 51 (H) 11/20/2019 0358   CREATININE 1.15 (H) 11/20/2019 0358   CALCIUM 8.3 (L) 11/20/2019 0358   GFRNONAA 60 (L) 11/20/2019 0358   GFRAA >60 11/20/2019 0358   CBC    Component Value Date/Time   WBC 14.4 (H) 11/20/2019 0358   RBC   3.72 (L) 11/20/2019 0358   HGB 10.2 (L) 11/20/2019 1015   HCT 30.0 (L) 11/20/2019 1015   PLT 67 (L) 11/20/2019 0358   MCV 88.4 11/20/2019 0358   MCH 28.5 11/20/2019 0358   MCHC 32.2 11/20/2019 0358   RDW 16.6 (H) 11/20/2019 0358   LYMPHSABS 0.5 (L) 12/10/2019 1940   MONOABS 0.5 11/20/2019 1940   EOSABS 0.0 11/28/2019 1940   BASOSABS 0.0 12/06/2019 1940    Assessment/Plan: 1. AKI- presumably ischemic ATN in setting of severe septic shock due to pneumoncoccal pneumonia. Further complicated by metabolic acidosis and hyperkalemia.  1. Initiated CRRT 11/18/19 given ongoing sepsis and metabolic acidosis 2. Keep even due to increased pressor support 3. All fluids:  4K/2.5Ca 2. VDRF- due to multifocal pneumonia complicated by ARDS- per PCCM 3. Septic shock due to pneumococcal pneumonia with bacteremia. Currently on 10mcg of levo. 4. Metabolic acidosis- due to #1. improved with CRRT and iv bicarb.  Stopped bicarb and follow 5. Hypocalcemia- repleted with IV calcium gluconate (given before starting bicarb) 6. Hyperkalemia- due to #1.improved with treatment. 7. Hypokalemia- will change to 4K bath on all fluids and replete IV prn. 8. Hypophosphatemia- will order to replete and follow.    A. , MD [facility] [phone] 

## 2019-11-20 NOTE — Procedures (Signed)
Admit: 11/13/2019 LOS: 4  Increased need for levophed up to 20 this mornign  Current CRRT Prescription: Start Date: 11/18/19 Catheter: RIJ trialysis catheter BFR: 200-450 ml/min Pre Blood Pump: 4K/2.5Ca at 500 ml/hr DFR: 4K/2.5Ca at 1500 ml/min Replacement Rate:4K/2.5Ca at 300 ml/min Goal UF: keep even Anticoagulation:  heparin Clotting: none     S: Intubated and sedated  O: 07/10 0701 - 07/11 0700 In: 2907.9 [I.V.:1107.9; NG/GT:1580; IV Piggyback:220] Out: 2830 [Urine:675]  Filed Weights   11/18/19 0500 11/19/19 0500 11/20/19 0354  Weight: 98 kg 100.1 kg 102.9 kg    Recent Labs  Lab 11/19/19 0425 11/19/19 0821 11/19/19 1636 11/20/19 0358 11/20/19 1015  NA 141   < > 141 139 139  K 2.8*   < > 3.9 4.0 4.0  CL 104  --  106 105  --   CO2 25  --  26 24  --   GLUCOSE 125*  --  134* 151*  --   BUN 87*  --  62* 51*  --   CREATININE 2.14*  --  1.45* 1.15*  --   CALCIUM 8.0*  --  8.2* 8.3*  --   PHOS 3.8  --  3.5 2.6  --    < > = values in this interval not displayed.   Recent Labs  Lab 11/24/2019 1940 11/21/2019 2324 11/18/19 0519 11/18/19 0520 11/19/19 0425 11/19/19 0425 11/19/19 0821 11/20/19 0358 11/20/19 1015  WBC 18.2*   < > 26.3*  --  10.1  --   --  14.4*  --   NEUTROABS 16.2*  --   --   --   --   --   --   --   --   HGB 11.8*   < > 11.6*   < > 11.7*   < > 12.6 10.6* 10.2*  HCT 37.4   < > 35.9*   < > 35.0*   < > 37.0 32.9* 30.0*  MCV 89.9   < > 89.1  --  87.3  --   --  88.4  --   PLT 99*   < > 85*  --  49*  --   --  67*  --    < > = values in this interval not displayed.    Scheduled Meds: . artificial tears  1 application Both Eyes B8G  . chlorhexidine gluconate (MEDLINE KIT)  15 mL Mouth Rinse BID  . Chlorhexidine Gluconate Cloth  6 each Topical Daily  . clonazePAM  2 mg Per Tube BID  . docusate  100 mg Per Tube BID  . feeding supplement (PROSource TF)  45 mL Per Tube BID  . heparin  5,000 Units Subcutaneous Q8H  . insulin aspart  0-9 Units  Subcutaneous Q4H  . mouth rinse  15 mL Mouth Rinse 10 times per day  . midodrine  10 mg Oral Q8H  . oxyCODONE  5 mg Per Tube Q6H  . pantoprazole (PROTONIX) IV  40 mg Intravenous Q24H  . polyethylene glycol  17 g Per Tube Daily  . QUEtiapine  100 mg Per Tube BID  . sertraline  200 mg Per Tube Daily  . sodium chloride flush  10-40 mL Intracatheter Q12H   Continuous Infusions: .  prismasol BGK 4/2.5 500 mL/hr at 11/20/19 0926  .  prismasol BGK 4/2.5 300 mL/hr at 11/20/19 0238  . sodium chloride    . sodium chloride 10 mL/hr at 11/20/19 1000  . albumin human 25 g (11/20/19 1049)  .  ceFAZolin (ANCEF) IV 2 g (11/20/19 1001)  . feeding supplement (VITAL HIGH PROTEIN) 1,000 mL (11/19/19 1400)  . fentaNYL infusion INTRAVENOUS 200 mcg/hr (11/20/19 1000)  . heparin 10,000 units/ 20 mL infusion syringe 1,550 Units/hr (11/20/19 1000)  . midazolam 7 mg/hr (11/20/19 1020)  . norepinephrine (LEVOPHED) Adult infusion 14 mcg/min (11/20/19 1000)  . prismasol BGK 4/2.5 1,500 mL/hr at 11/20/19 0922  . sodium phosphate  Dextrose 5% IVPB     PRN Meds:.Place/Maintain arterial line **AND** sodium chloride, acetaminophen (TYLENOL) oral liquid 160 mg/5 mL, fentaNYL (SUBLIMAZE) injection, heparin, heparin, heparin, heparin, heparin, midazolam, sodium chloride flush, vecuronium  ABG    Component Value Date/Time   PHART 7.332 (L) 11/20/2019 1015   PCO2ART 50.9 (H) 11/20/2019 1015   PO2ART 72 (L) 11/20/2019 1015   HCO3 26.8 11/20/2019 1015   TCO2 28 11/20/2019 1015   ACIDBASEDEF 7.0 (H) 11/18/2019 0520   O2SAT 92.0 11/20/2019 1015    A/P  1. Dialysis dependent AKI now oliguric.   Donetta Potts, MD [facility] pgr [phone]

## 2019-11-21 ENCOUNTER — Inpatient Hospital Stay (HOSPITAL_COMMUNITY): Payer: Medicaid Other

## 2019-11-21 DIAGNOSIS — J80 Acute respiratory distress syndrome: Secondary | ICD-10-CM

## 2019-11-21 LAB — GLUCOSE, CAPILLARY
Glucose-Capillary: 123 mg/dL — ABNORMAL HIGH (ref 70–99)
Glucose-Capillary: 124 mg/dL — ABNORMAL HIGH (ref 70–99)
Glucose-Capillary: 112 mg/dL — ABNORMAL HIGH (ref 70–99)
Glucose-Capillary: 126 mg/dL — ABNORMAL HIGH (ref 70–99)
Glucose-Capillary: 114 mg/dL — ABNORMAL HIGH (ref 70–99)
Glucose-Capillary: 143 mg/dL — ABNORMAL HIGH (ref 70–99)

## 2019-11-21 LAB — POCT ACTIVATED CLOTTING TIME
Activated Clotting Time: 219 seconds
Activated Clotting Time: 213 seconds
Activated Clotting Time: 213 seconds
Activated Clotting Time: 219 seconds
Activated Clotting Time: 202 seconds
Activated Clotting Time: 202 seconds
Activated Clotting Time: 191 seconds

## 2019-11-21 LAB — MAGNESIUM: Magnesium: 2.7 mg/dL — ABNORMAL HIGH (ref 1.7–2.4)

## 2019-11-21 LAB — CBC
HCT: 29.6 % — ABNORMAL LOW (ref 36.0–46.0)
Hemoglobin: 9.1 g/dL — ABNORMAL LOW (ref 12.0–15.0)
MCH: 28.2 pg (ref 26.0–34.0)
MCHC: 30.7 g/dL (ref 30.0–36.0)
MCV: 91.6 fL (ref 80.0–100.0)
Platelets: 89 10*3/uL — ABNORMAL LOW (ref 150–400)
RBC: 3.23 MIL/uL — ABNORMAL LOW (ref 3.87–5.11)
RDW: 16.5 % — ABNORMAL HIGH (ref 11.5–15.5)
WBC: 14.1 10*3/uL — ABNORMAL HIGH (ref 4.0–10.5)
nRBC: 0.3 % — ABNORMAL HIGH (ref 0.0–0.2)

## 2019-11-21 LAB — RENAL FUNCTION PANEL
Albumin: 2.5 g/dL — ABNORMAL LOW (ref 3.5–5.0)
Albumin: 2.4 g/dL — ABNORMAL LOW (ref 3.5–5.0)
Anion gap: 9 (ref 5–15)
Anion gap: 7 (ref 5–15)
BUN: 36 mg/dL — ABNORMAL HIGH (ref 6–20)
BUN: 34 mg/dL — ABNORMAL HIGH (ref 6–20)
CO2: 26 mmol/L (ref 22–32)
CO2: 26 mmol/L (ref 22–32)
Calcium: 8.6 mg/dL — ABNORMAL LOW (ref 8.9–10.3)
Calcium: 8.4 mg/dL — ABNORMAL LOW (ref 8.9–10.3)
Chloride: 104 mmol/L (ref 98–111)
Chloride: 106 mmol/L (ref 98–111)
Creatinine, Ser: 0.85 mg/dL (ref 0.44–1.00)
Creatinine, Ser: 0.87 mg/dL (ref 0.44–1.00)
GFR calc Af Amer: >60 mL/min (ref >60)
GFR calc Af Amer: >60 mL/min (ref >60)
GFR calc non Af Amer: >60 mL/min (ref >60)
GFR calc non Af Amer: >60 mL/min (ref >60)
Glucose, Bld: 130 mg/dL — ABNORMAL HIGH (ref 70–99)
Glucose, Bld: 137 mg/dL — ABNORMAL HIGH (ref 70–99)
Phosphorus: 3.3 mg/dL (ref 2.5–4.6)
Phosphorus: 2.9 mg/dL (ref 2.5–4.6)
Potassium: 4.1 mmol/L (ref 3.5–5.1)
Potassium: 4.4 mmol/L (ref 3.5–5.1)
Sodium: 139 mmol/L (ref 135–145)
Sodium: 139 mmol/L (ref 135–145)

## 2019-11-21 LAB — POCT I-STAT 7, (LYTES, BLD GAS, ICA,H+H)
Acid-Base Excess: 1 mmol/L (ref 0.0–2.0)
Bicarbonate: 28.8 mmol/L — ABNORMAL HIGH (ref 20.0–28.0)
Calcium, Ion: 1.27 mmol/L (ref 1.15–1.40)
HCT: 26 % — ABNORMAL LOW (ref 36.0–46.0)
Hemoglobin: 8.8 g/dL — ABNORMAL LOW (ref 12.0–15.0)
O2 Saturation: 97 %
Potassium: 4.4 mmol/L (ref 3.5–5.1)
Sodium: 139 mmol/L (ref 135–145)
TCO2: 31 mmol/L (ref 22–32)
pCO2 arterial: 61.7 mmHg — ABNORMAL HIGH (ref 32.0–48.0)
pH, Arterial: 7.277 — ABNORMAL LOW (ref 7.350–7.450)
pO2, Arterial: 109 mmHg — ABNORMAL HIGH (ref 83.0–108.0)

## 2019-11-21 LAB — CULTURE, RESPIRATORY W GRAM STAIN: Culture: NORMAL

## 2019-11-21 LAB — TRIGLYCERIDES: Triglycerides: 139 mg/dL (ref <150)

## 2019-11-21 LAB — APTT: aPTT: 165 seconds (ref 24–36)

## 2019-11-21 MED ORDER — PROSOURCE TF PO LIQD
90.0000 mL | Freq: Three times a day (TID) | ORAL | Status: DC
  Administered 2019-11-21 – 2019-11-23 (×7): 90 mL
  Filled 2019-11-21 (×9): qty 90

## 2019-11-21 MED ORDER — VITAL 1.5 CAL PO LIQD
1000.0000 mL | ORAL | Status: DC
  Administered 2019-11-21 – 2019-11-23 (×3): 1000 mL
  Filled 2019-11-21 (×4): qty 1000

## 2019-11-21 MED ORDER — FUROSEMIDE 10 MG/ML IJ SOLN: 40.0000 mg | Freq: Once | INTRAMUSCULAR | Status: DC

## 2019-11-21 MED ORDER — DEXMEDETOMIDINE HCL IN NACL 400 MCG/100ML IV SOLN
0.4000 ug/kg/h | INTRAVENOUS | Status: DC
  Administered 2019-11-21 (×2): 1 ug/kg/h via INTRAVENOUS
  Administered 2019-11-21: 0.9 ug/kg/h via INTRAVENOUS
  Administered 2019-11-21 – 2019-11-22 (×3): 1 ug/kg/h via INTRAVENOUS
  Administered 2019-11-22 – 2019-11-23 (×10): 1.2 ug/kg/h via INTRAVENOUS
  Filled 2019-11-21 (×7): qty 100
  Filled 2019-11-21: qty 200
  Filled 2019-11-21 (×8): qty 100

## 2019-11-21 MED ORDER — MIDODRINE HCL 5 MG PO TABS
10.0000 mg | ORAL_TABLET | Freq: Three times a day (TID) | ORAL | Status: DC
  Administered 2019-11-21 – 2019-11-22 (×3): 10 mg via ORAL
  Filled 2019-11-21 (×3): qty 2

## 2019-11-21 NOTE — Progress Notes (Signed)
Nutrition Follow-up  DOCUMENTATION CODES:   Not applicable  INTERVENTION:   Tube feeding via OG tube: - Change to Vital 1.5 @ 50 ml/hr (1200 ml/day) - ProSource TF 90 ml TID  Tube feeding regimen provides 2040 kcal, 147 grams of protein, and 917 ml of H2O.  NUTRITION DIAGNOSIS:   Inadequate oral intake related to inability to eat as evidenced by NPO status.  Ongoing  GOAL:   Provide needs based on ASPEN/SCCM guidelines  Met via TF  MONITOR:   Vent status, Labs, Weight trends, TF tolerance, Skin, I & O's  REASON FOR ASSESSMENT:   Ventilator, Consult Enteral/tube feeding initiation and management  ASSESSMENT:   39 year old female who presented on 7/07 from [facility] with acute hypoxic/hypercapnic respiratory failure, ARDS, AKI. PMH of anxiety, depression, gastric bypass surgery. Pt required intubation and was admitted with multifocal pneumonia.  Pt admitted with severe ARDS and septic shock with multiorgan injury secondary to pneumococcal pneumonia/bacteremia.  7/09 - CRRT initiated  Discussed pt with RN and during ICU rounds. Pt remains on CRRT. Per Nephrology note, "no restart CRRT if clots or due for filter changeout given hemodynamic stability - could switch to iHD."  OG tube remains in place with TF infusing.  Weight up 3.4 kg since admit. Pt with mild pitting generalized edema and mild pitting edema to BUE, BLE, perineal area, and sacral area. EDW: 97 kg.  Pt off propofol. Will adjust TF regimen.  Current TF: Vital High Protein @ 55 ml/hr, ProSource TF 45 ml BID  Patient is currently intubated on ventilator support MV: 12.7 L/min Temp (24hrs), Avg:98 F (36.7 C), Min:96.4 F (35.8 C), Max:99 F (37.2 C) BP (a-line): 90/37 MAP (a-line): 52  Drips: Precedex: 17.6 ml/hr Fentanyl: 20 ml/hr Versed: 6 ml/hr Levophed: off this AM  Medications reviewed and include: colace, SSI q 4 hours, protonix, miralax, IV abx  Labs reviewed: magnesium 2.7,  hemoglobin 9.1 CBG's: 123-160 x 24 hours  UOP: 589 ml x 24 hours CRRT UF: 2817 ml x 24 hours I/O's: +5.8 L since admit  Diet Order:   Diet Order            Diet NPO time specified  Diet effective now                 EDUCATION NEEDS:   No education needs have been identified at this time  Skin:  Skin Assessment: Skin Integrity Issues: Stage I: coccyx  Last BM:  11/19/19  Height:   Ht Readings from Last 1 Encounters:  12/05/2019 '5\' 10"'  (1.778 m)    Weight:   Wt Readings from Last 1 Encounters:  11/21/19 100.4 kg    Ideal Body Weight:  68.2 kg  BMI:  Body mass index is 31.76 kg/m.  Estimated Nutritional Needs:   Kcal:  2052  Protein:  136-150 grams  Fluid:  >/= 2.0 L    Gaynell Face, MS, RD, LDN Inpatient Clinical Dietitian Please see AMiON for contact information.

## 2019-11-21 NOTE — Progress Notes (Addendum)
eLink Physician-Brief Progress Note Patient Name: Rachael Harvey DOB: [birth year] MRN: [id number]   Date of Service  11/21/2019  HPI/Events of Note  Review of CXR reveals minimal progression in diffuse bilateral ground-glass and confluent pulmonary opacities since earlier this day. No pneumothorax. I/O about 6.3 liters positive since admission. Creatinine = 0.87. Patient on CRRT.  eICU Interventions  Plan: 1. Nursing to contact Nephrology to see if they can pull fluid off.  2. Continue present ventilator management.      Intervention Category Major Interventions: Hypoxemia - evaluation and management;Respiratory failure - evaluation and management  Lenell Antu 11/21/2019, 10:08 PM

## 2019-11-21 NOTE — Progress Notes (Signed)
eLink Physician-Brief Progress Note Patient Name: Rachael Harvey DOB: [birth year] MRN: [id number]   Date of Service  11/21/2019  HPI/Events of Note  Hypoxia - Sat = 87%. CXR from early AM today with diffuse bilateral opacities.   eICU Interventions  Plam: 1. Recruitment maneuver now.  2. Increase PEEP to 14. 3. Portable CXR STAT.      Intervention Category Major Interventions: Respiratory failure - evaluation and management;Hypoxemia - evaluation and management  Lenell Antu 11/21/2019, 9:43 PM

## 2019-11-21 NOTE — Progress Notes (Addendum)
NAME:  Rachael Harvey, MRN:  [id number], DOB:  1980-12-26, LOS: 5 ADMISSION DATE:  December 07, 2019,   CHIEF COMPLAINT:  Shortness of breath  Brief History   39 year old female was transferred from outside hospital with acute hypoxic/hypercapnic respiratory failure, ARDS and acute kidney injury  History of present illness   39 year old female with history of anxiety and depression who was transferred from [facility] for worsening respiratory failure. Patient is intubated so most of the history is taken from patient's chart, as per chart patient was not feeling well for last few days with a cough, shortness of breath and fever.  When EMS was called patient was noted to be hypoxic into the 60s, she was put on high flow and was transferred to [facility] emergency department, where she was put on BiPAP but she continued to remain hypoxic so decision was made to intubate, she was admitted in the hospital with acute hypoxic respiratory failure due to multifocal pneumonia. Over the last 2 days she has been getting IV antibiotics, but her condition continued to deteriorate with hypoxia, hypercapnia and acute metabolic acidosis associated with acute kidney injury so she was transferred to [facility] for further management.  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 Urine output decreased for two days and  increased FIO2/peep per RT note yesterday.   Objective   Blood pressure (!) 123/52, pulse 70, temperature (!) 96.4 F (35.8 C), resp. rate 16, height 5\' 10"  (1.778 m), weight 100.4 kg, SpO2 94 %.    Vent Mode: PCV FiO2 (%):  [70 %-80 %] 70 % Set Rate:  [32 bmp] 32 bmp Vt Set:  [480 mL] 480 mL PEEP:  [10 cmH20-12 cmH20] 12 cmH20 Plateau Pressure:  [32 cmH20] 32 cmH20   Intake/Output Summary (Last 24 hours) at 11/21/2019 0707 Last data filed at 11/21/2019 0600 Gross per 24 hour  Intake 3151.85 ml  Output 3280 ml  Net -128.15 ml   Filed Weights   11/19/19 0500 11/20/19 0354 11/21/19 0449  Weight: 100.1 kg 102.9 kg 100.4 kg    Examination: GEN: ill appearing woman on vent HEENT: ETT in place , trachea midline CV: RRR, ext warm PULM: BL vent breath sounds  GI: Soft, +BS EXT: trace  Edema no deformities NEURO: flailed ext 7/10 with sedation wean, not weaning sedation today given oxygenation issues PSYCH: RASS -5 SKIN: warm, dry   Labs Reviewed:  CBG at goal. Triglycerides trended down since stopping propofol. Cr nl, Bun trending down. Platelets trending up, 89k. Mg elevated 2.7   Imaging: Chest xray 7/12 - relatively unchanged bilateral opacities Resolved Hospital Problem list     Assessment & Plan:  Severe ARDS and septic shock with multiorgan injury secondary to pneumococcal pneumonia/bacteremia.  No obvious vegetation on TTE. Persistent severe O2 needs with a great deal of this coming from vent dys-synchrony. Chest xray stable.   - Consider consulting cardiology for TEE  - Usual VAP prevention bundle and ARDS protocol  PEEP/FiO2 wean - PRN vecuronium pushes to prevent derecruitment, there has been no happy medium with her sedation  - f/u blood cultures from 7/10 to document clearance of bacteremia, NGTD - Continue to try to switch off drips to oral sedation meds - Cefazolin, start date 7/8, anticipated prolonged course given severity of illness  Thrombocytopenia-  Trending up, continue to monitor   Leukocytosis Leukocytosis trending up on 11th , steady today. Tracheal aspirate from the 10th has been cultured again as the gram stain was unrevealing. W  Bipolar Disorder Patient is on Seroquel 150 qhs at home. Will adjust dosing to help with sedation as mentioned above.  - Seroquel 100 mg BID - Sertraline 200 mg   Substance use disorder.  History of polysubstance use disorder.  Makes sedation difficult.   Best practice:  Diet: TF Pain/Anxiety/Delirium protocol (if indicated): N/A VAP protocol (if indicated): Y DVT prophylaxis: Subcu heparin GI prophylaxis: Famotidine Glucose control: Sliding scale insulin Mobility: Bedrest Code Status: Full code Family Communication: pending, lots of issues here Disposition: ICU  Thurmon Fair, MD PGY2 Internal Medicine    PCCM Attending:  39 yo FM, from [place],  Strep pne bactermia, septic shock, MODS, on CVVHD, ARDS.  Still issues with dyssynchrony on vent.   BP (!) 127/52 (BP Location: Left Arm)    Pulse 100    Temp (!) 97.3 F (36.3 C)    Resp (!) 32    Ht 5\' 10"  (1.778 m)    Wt 100.4 kg    SpO2 (!) 86%    BMI 31.76 kg/m   Gen: chronically ill appearing FM, on MV  HENT: ETT in place  Heart: Tachy, regular, S1 s2 Lungs: BL vented breaths  Abd: Soft, nt nd  Labs: reviewed   A:  Septic Shock  Strep bacteremia  MODS  ARF on CVVHD  ARDS on MV  High sedation requirements  H/o polysubstance abuse   P:  Adult vent ards protocol  Weaning as tolerated  We need to work on decreasing her sedation requirements Tolerating cvvhd and good BP Renal considering HD  The issue will be weaning from vent support  She may need trach   This patient is critically ill with multiple organ system failure; which, requires frequent high complexity decision making, assessment, support, evaluation, and titration of therapies. This was completed through the application of advanced monitoring technologies and  extensive interpretation of multiple databases. During this encounter critical care time was devoted to patient care services described in this note for 34 minutes.  , DO [facility] 11/21/2019 10:24 AM

## 2019-11-21 NOTE — Progress Notes (Signed)
Patient ID: Rachael Harvey, female   DOB: May 22, 1980, 39 y.o.   MRN: [id number] S: Off pressors now.  No new issues.  Yest I/Os 3.2 / 3.4 (UOP 537m, UF 2.8L), net neg 1144m  O:BP (!) 127/52 (BP Location: Left Arm)   Pulse 72   Temp (!) 96.8 F (36 C)   Resp (!) 32   Ht '5\' 10"'  (1.778 m)   Wt 100.4 kg   SpO2 93%   BMI 31.76 kg/m   Intake/Output Summary (Last 24 hours) at 11/21/2019 0901 Last data filed at 11/21/2019 0800 Gross per 24 hour  Intake 3200.8 ml  Output 3175 ml  Net 25.8 ml   Intake/Output: I/O last 3 completed shifts: In: 4685.9 [I.V.:1480.2; Other:120; NG/GT:2170; IV Piggyback:915.7] Out: 478871Urine:904; Other:3883]  Intake/Output this shift:  Total I/O In: 103 [I.V.:48; NG/GT:55] Out: 113 [Urine:30; Other:83] Weight change: -2.5 kg Gen: intubated and sedated  CVS: RRR  Resp: occ rhonchi bilaterally Abd: +BS, soft, NT/ND Ext: 1+ edema  Recent Labs  Lab 12/02/2019 1940 11/27/2019 2324 11/18/19 0519 11/18/19 0520 11/18/19 1700 11/19/19 0419 11/19/19 0425 11/19/19 0821 11/19/19 1636 11/20/19 0358 11/20/19 1015 11/20/19 1625 11/21/19 0450  NA 137   < > 137   < > 141   < > 141 142 141 139 139 138 139  K 4.0   < > 3.1*   < > 3.2*   < > 2.8* 2.9* 3.9 4.0 4.0 3.9 4.1  CL 107   < > 103  --  106  --  104  --  106 105  --  104 104  CO2 19*   < > 18*  --  21*  --  25  --  26 24  --  25 26  GLUCOSE 127*   < > 174*  --  111*  --  125*  --  134* 151*  --  142* 130*  BUN 87*   < > 126*  --  132*  --  87*  --  62* 51*  --  41* 36*  CREATININE 4.04*   < > 4.41*  --  3.87*  --  2.14*  --  1.45* 1.15*  --  1.00 0.85  ALBUMIN 1.9*   < > 1.8*  --  1.7*  --  1.7*  --  1.7* 1.6*  --  2.0* 2.5*  CALCIUM 8.0*   < > 8.5*  --  8.3*  --  8.0*  --  8.2* 8.3*  --  8.4* 8.6*  PHOS  --    < > 6.8*  --  6.6*  --  3.8  --  3.5 2.6  --  3.1 3.3  AST 75*  --   --   --   --   --   --   --   --   --   --   --   --   ALT 53*  --   --   --   --   --   --   --   --   --   --   --   --     < > = values in this interval not displayed.   Liver Function Tests: Recent Labs  Lab 12/01/2019 1940 11/17/19 1142 11/20/19 0358 11/20/19 1625 11/21/19 0450  AST 75*  --   --   --   --   ALT 53*  --   --   --   --  ALKPHOS 69  --   --   --   --   BILITOT 1.3*  --   --   --   --   PROT 5.2*  --   --   --   --   ALBUMIN 1.9*   < > 1.6* 2.0* 2.5*   < > = values in this interval not displayed.   No results for input(s): LIPASE, AMYLASE in the last 168 hours. No results for input(s): AMMONIA in the last 168 hours. CBC: Recent Labs  Lab 12/06/2019 1940 12/10/2019 2324 11/17/19 0257 11/17/19 0356 11/18/19 0519 11/18/19 0520 11/19/19 0425 11/19/19 0821 11/20/19 0358 11/20/19 1015 11/21/19 0450  WBC 18.2*   < > 22.1*   < > 26.3*   < > 10.1  --  14.4*  --  14.1*  NEUTROABS 16.2*  --   --   --   --   --   --   --   --   --   --   HGB 11.8*   < > 11.7*   < > 11.6*   < > 11.7*   < > 10.6* 10.2* 9.1*  HCT 37.4   < > 35.9*   < > 35.9*   < > 35.0*   < > 32.9* 30.0* 29.6*  MCV 89.9   < > 88.6  --  89.1  --  87.3  --  88.4  --  91.6  PLT 99*   < > 105*   < > 85*   < > 49*  --  67*  --  89*   < > = values in this interval not displayed.   Cardiac Enzymes: No results for input(s): CKTOTAL, CKMB, CKMBINDEX, TROPONINI in the last 168 hours. CBG: Recent Labs  Lab 11/20/19 1608 11/20/19 1955 11/20/19 2345 11/21/19 0335 11/21/19 0806  GLUCAP 124* 160* 126* 123* 124*    Iron Studies: No results for input(s): IRON, TIBC, TRANSFERRIN, FERRITIN in the last 72 hours. Studies/Results: DG Chest 1 View  Result Date: 11/21/2019 CLINICAL DATA:  39 year old female with ARDS. EXAM: CHEST  1 VIEW COMPARISON:  Chest radiograph dated 11/19/2019. FINDINGS: Endotracheal tube above the carina, enteric tube extends below the diaphragm with tip beyond the inferior margin of the image, and right IJ central venous line with tip over central SVC close to the cavoatrial junction. A right-sided PICC is  partially visualized with tip in the region of the right axilla in similar position. Recommend advancing of the PICC line. Diffuse bilateral interstitial densities and confluent airspace opacities similar to prior radiograph. No large pleural effusion or pneumothorax. Stable cardiomediastinal silhouette. No acute osseous pathology. IMPRESSION: 1. Stable support apparatus. 2. Bilateral pulmonary opacities with no significant interval change. Electronically Signed   By: Anner Crete M.D.   On: 11/21/2019 03:39   . artificial tears  1 application Both Eyes G2E  . chlorhexidine gluconate (MEDLINE KIT)  15 mL Mouth Rinse BID  . Chlorhexidine Gluconate Cloth  6 each Topical Daily  . clonazePAM  2 mg Per Tube BID  . docusate  100 mg Per Tube BID  . feeding supplement (PROSource TF)  45 mL Per Tube BID  . heparin  5,000 Units Subcutaneous Q8H  . insulin aspart  0-9 Units Subcutaneous Q4H  . mouth rinse  15 mL Mouth Rinse 10 times per day  . midodrine  10 mg Oral Q8H  . oxyCODONE  5 mg Per Tube Q6H  . pantoprazole (PROTONIX) IV  40 mg Intravenous Q24H  . polyethylene glycol  17 g Per Tube Daily  . QUEtiapine  100 mg Per Tube BID  . sertraline  200 mg Per Tube Daily  . sodium chloride flush  10-40 mL Intracatheter Q12H    BMET    Component Value Date/Time   NA 139 11/21/2019 0450   K 4.1 11/21/2019 0450   CL 104 11/21/2019 0450   CO2 26 11/21/2019 0450   GLUCOSE 130 (H) 11/21/2019 0450   BUN 36 (H) 11/21/2019 0450   CREATININE 0.85 11/21/2019 0450   CALCIUM 8.6 (L) 11/21/2019 0450   GFRNONAA >60 11/21/2019 0450   GFRAA >60 11/21/2019 0450   CBC    Component Value Date/Time   WBC 14.1 (H) 11/21/2019 0450   RBC 3.23 (L) 11/21/2019 0450   HGB 9.1 (L) 11/21/2019 0450   HCT 29.6 (L) 11/21/2019 0450   PLT 89 (L) 11/21/2019 0450   MCV 91.6 11/21/2019 0450   MCH 28.2 11/21/2019 0450   MCHC 30.7 11/21/2019 0450   RDW 16.5 (H) 11/21/2019 0450   LYMPHSABS 0.5 (L) 12/05/2019 1940    MONOABS 0.5 11/22/2019 1940   EOSABS 0.0 12/06/2019 1940   BASOSABS 0.0 11/13/2019 1940    Assessment/Plan: 1. AKI- presumably ischemic ATN in setting of severe septic shock due to pneumoncoccal pneumonia. Further complicated by metabolic acidosis and hyperkalemia.  1. Initiated CRRT 11/18/19 given ongoing sepsis and metabolic acidosis 2. Net + 5.9L for admission, now edema; hemodynamically stable so goal net 50-146m/hr neg.  3. All fluids:  4K/2.5Ca 4. D/w RN - no restart CRRT if clots or due for filter changeout given hemodynamic stability - could switch to iHD.   Cont to monitor for recovery. 2. VDRF- due to multifocal pneumonia complicated by ARDS- per PCCM 3. Septic shock due to pneumococcal pneumonia with bacteremia. Improving 4. Metabolic acidosis- due to #1. improved with CRRT and iv bicarb.  Stopped bicarb and follow 5. Hypocalcemia- repleted with IV calcium gluconate (given before starting bicarb), acceptable today.  LJannifer HickMD [facility] Pager [phone]

## 2019-11-21 NOTE — Progress Notes (Signed)
Before starting bath, pt given PRN dose of Vec to prevent desaturations when lying flat for bath. Pt initially tolerated bath until turning when pt quickly desaturated to the 70's. Bath quickly finished and pt pulled up in bed and HOB raised to >30. Gave pt some time to try and "recover" from bath, however O2 sats never returned above 88%. Pts HR also increased from the 70-80's to 110's. Unchanged breath sounds and ETT positioned correctly. No secretions suctioned from ETT.  RT called to bedside. ELINK called and made aware. New orders placed.

## 2019-11-22 DIAGNOSIS — E872 Acidosis: Secondary | ICD-10-CM

## 2019-11-22 DIAGNOSIS — A403 Sepsis due to Streptococcus pneumoniae: Principal | ICD-10-CM

## 2019-11-22 LAB — POCT I-STAT 7, (LYTES, BLD GAS, ICA,H+H)
Acid-Base Excess: 0 mmol/L (ref 0.0–2.0)
Acid-Base Excess: 1 mmol/L (ref 0.0–2.0)
Bicarbonate: 28.5 mmol/L — ABNORMAL HIGH (ref 20.0–28.0)
Bicarbonate: 27.2 mmol/L (ref 20.0–28.0)
Calcium, Ion: 1.29 mmol/L (ref 1.15–1.40)
Calcium, Ion: 1.28 mmol/L (ref 1.15–1.40)
HCT: 29 % — ABNORMAL LOW (ref 36.0–46.0)
HCT: 28 % — ABNORMAL LOW (ref 36.0–46.0)
Hemoglobin: 9.9 g/dL — ABNORMAL LOW (ref 12.0–15.0)
Hemoglobin: 9.5 g/dL — ABNORMAL LOW (ref 12.0–15.0)
O2 Saturation: 99 %
O2 Saturation: 93 %
Patient temperature: 37.9
Potassium: 4.8 mmol/L (ref 3.5–5.1)
Potassium: 4.6 mmol/L (ref 3.5–5.1)
Sodium: 137 mmol/L (ref 135–145)
Sodium: 138 mmol/L (ref 135–145)
TCO2: 31 mmol/L (ref 22–32)
TCO2: 29 mmol/L (ref 22–32)
pCO2 arterial: 67.5 mmHg (ref 32.0–48.0)
pCO2 arterial: 51.4 mmHg — ABNORMAL HIGH (ref 32.0–48.0)
pH, Arterial: 7.234 — ABNORMAL LOW (ref 7.350–7.450)
pH, Arterial: 7.336 — ABNORMAL LOW (ref 7.350–7.450)
pO2, Arterial: 140 mmHg — ABNORMAL HIGH (ref 83.0–108.0)
pO2, Arterial: 75 mmHg — ABNORMAL LOW (ref 83.0–108.0)

## 2019-11-22 LAB — RENAL FUNCTION PANEL
Albumin: 2.5 g/dL — ABNORMAL LOW (ref 3.5–5.0)
Albumin: 2.3 g/dL — ABNORMAL LOW (ref 3.5–5.0)
Anion gap: 8 (ref 5–15)
Anion gap: 8 (ref 5–15)
BUN: 34 mg/dL — ABNORMAL HIGH (ref 6–20)
BUN: 35 mg/dL — ABNORMAL HIGH (ref 6–20)
CO2: 25 mmol/L (ref 22–32)
CO2: 25 mmol/L (ref 22–32)
Calcium: 8.7 mg/dL — ABNORMAL LOW (ref 8.9–10.3)
Calcium: 8.5 mg/dL — ABNORMAL LOW (ref 8.9–10.3)
Chloride: 105 mmol/L (ref 98–111)
Chloride: 103 mmol/L (ref 98–111)
Creatinine, Ser: 0.92 mg/dL (ref 0.44–1.00)
Creatinine, Ser: 1.06 mg/dL — ABNORMAL HIGH (ref 0.44–1.00)
GFR calc Af Amer: >60 mL/min (ref >60)
GFR calc Af Amer: >60 mL/min (ref >60)
GFR calc non Af Amer: >60 mL/min (ref >60)
GFR calc non Af Amer: >60 mL/min (ref >60)
Glucose, Bld: 143 mg/dL — ABNORMAL HIGH (ref 70–99)
Glucose, Bld: 131 mg/dL — ABNORMAL HIGH (ref 70–99)
Phosphorus: 3.8 mg/dL (ref 2.5–4.6)
Phosphorus: 3.6 mg/dL (ref 2.5–4.6)
Potassium: 4.9 mmol/L (ref 3.5–5.1)
Potassium: 4.8 mmol/L (ref 3.5–5.1)
Sodium: 138 mmol/L (ref 135–145)
Sodium: 136 mmol/L (ref 135–145)

## 2019-11-22 LAB — CBC
HCT: 30.2 % — ABNORMAL LOW (ref 36.0–46.0)
Hemoglobin: 9.2 g/dL — ABNORMAL LOW (ref 12.0–15.0)
MCH: 28.8 pg (ref 26.0–34.0)
MCHC: 30.5 g/dL (ref 30.0–36.0)
MCV: 94.4 fL (ref 80.0–100.0)
Platelets: 112 10*3/uL — ABNORMAL LOW (ref 150–400)
RBC: 3.2 MIL/uL — ABNORMAL LOW (ref 3.87–5.11)
RDW: 16.7 % — ABNORMAL HIGH (ref 11.5–15.5)
WBC: 18 10*3/uL — ABNORMAL HIGH (ref 4.0–10.5)
nRBC: 0.3 % — ABNORMAL HIGH (ref 0.0–0.2)

## 2019-11-22 LAB — GLUCOSE, CAPILLARY
Glucose-Capillary: 129 mg/dL — ABNORMAL HIGH (ref 70–99)
Glucose-Capillary: 117 mg/dL — ABNORMAL HIGH (ref 70–99)
Glucose-Capillary: 140 mg/dL — ABNORMAL HIGH (ref 70–99)
Glucose-Capillary: 120 mg/dL — ABNORMAL HIGH (ref 70–99)
Glucose-Capillary: 118 mg/dL — ABNORMAL HIGH (ref 70–99)
Glucose-Capillary: 137 mg/dL — ABNORMAL HIGH (ref 70–99)

## 2019-11-22 LAB — POCT ACTIVATED CLOTTING TIME
Activated Clotting Time: 186 seconds
Activated Clotting Time: 191 s
Activated Clotting Time: 191 s
Activated Clotting Time: 191 seconds
Activated Clotting Time: 191 seconds
Activated Clotting Time: 191 seconds
Activated Clotting Time: 197 s
Activated Clotting Time: 202 seconds

## 2019-11-22 LAB — APTT: aPTT: 79 s — ABNORMAL HIGH (ref 24–36)

## 2019-11-22 LAB — MAGNESIUM: Magnesium: 2.5 mg/dL — ABNORMAL HIGH (ref 1.7–2.4)

## 2019-11-22 LAB — TRIGLYCERIDES: Triglycerides: 111 mg/dL (ref <150)

## 2019-11-22 MED ORDER — PENICILLIN G POTASSIUM 20000000 UNITS IJ SOLR
4.0000 10*6.[IU] | INTRAVENOUS | Status: DC
  Administered 2019-11-22 – 2019-11-23 (×6): 4 10*6.[IU] via INTRAVENOUS
  Filled 2019-11-22 (×12): qty 4

## 2019-11-22 MED ORDER — MIDODRINE HCL 5 MG PO TABS
10.0000 mg | ORAL_TABLET | Freq: Three times a day (TID) | ORAL | Status: DC
  Administered 2019-11-22 – 2019-11-23 (×5): 10 mg
  Filled 2019-11-22 (×5): qty 2

## 2019-11-22 NOTE — Progress Notes (Addendum)
NAME:  Rachael Harvey, MRN:  [id number], DOB:  [birth year], LOS: 6 ADMISSION DATE:  2019/11/20,   CHIEF COMPLAINT:  Shortness of breath  Brief History   39 year old female was transferred from outside hospital with acute hypoxic/hypercapnic respiratory failure, ARDS and acute kidney injury  History of present illness   39 year old female with history of anxiety and depression who was transferred from [facility] for worsening respiratory failure. Patient is intubated so most of the history is taken from patient's chart, as per chart patient was not feeling well for last few days with a cough, shortness of breath and fever.  When EMS was called patient was noted to be hypoxic into the 60s, she was put on high flow and was transferred to [facility] emergency department, where she was put on BiPAP but she continued to remain hypoxic so decision was made to intubate, she was admitted in the hospital with acute hypoxic respiratory failure due to multifocal pneumonia. Over the last 2 days she has been getting IV antibiotics, but her condition continued to deteriorate with hypoxia, hypercapnia and acute metabolic acidosis associated with acute kidney injury so she was transferred to [facility] for further management.  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17  Significant Hospital Events   7/7 Transferred to [facility] from [facility] 7/8 renal function declining--nephrology consulted  Consults:  nephrology  Procedures:  CVL 7/7 >> Arterial line 7/7 >>  Significant Diagnostic Tests:  7/7 CXR>>diffuse b/l infiltrates 7/12 CXR: Bilateral ground glass and confluene Micro Data:  7/5 Blood culture strep pneumo 7/7 >> 7/7 MRSA PCR Swab: NG 7/10 Sputum culture: NGTD 7/10 Blood cultures: NGTD @ 2days Antimicrobials:  7/7 Vancomycin >>7/7 7/7 Cefepime>>7/8 7/8 Cefazolin >> Interim history/subjective:  Overnight desating and required eLink  physician to be contacted for vent adjustments. Increased PEEP and FIO2.   Objective   Blood pressure (!) 103/44, pulse 77, temperature 100.2 F (37.9 C), resp. rate (!) 30, height 5\' 10"  (1.778 m), weight 102.1 kg, SpO2 95 %.    Vent Mode: PCV FiO2 (%):  [70 %-100 %] 70 % Set Rate:  [32 bmp-35 bmp] 35 bmp PEEP:  [12 cmH20-14 cmH20] 14 cmH20 Plateau Pressure:  [27 cmH20-29 cmH20] 27 cmH20   Intake/Output Summary (Last 24 hours) at 11/22/2019 0908 Last data filed at 11/22/2019 0825 Gross per 24 hour  Intake 3485.31 ml  Output 3102 ml  Net 383.31 ml   Filed Weights   11/20/19 0354 11/21/19 0449 11/22/19 0500  Weight: 102.9 kg 100.4 kg 102.1 kg    Examination: GEN: ill appearing woman on vent HEENT: ETT in place , trachea midline CV: RRR, ext warm PULM: BL vent breath sounds  GI: Soft, +BS EXT: trace  Edema no deformities NEURO: flailed ext 7/10 with sedation wean, not weaning sedation today given oxygenation issues PSYCH: RASS -5 SKIN: warm, dry   Labs Reviewed:  CBG at goal. Triglycerides trended down since stopping propofol. Cr nl, Bun trending down. Platelets trending up, 89k. Mg elevated 2.7   Imaging: Chest xray 7/12 - relatively unchanged bilateral opacities Resolved Hospital Problem list     Assessment & Plan:  ARDS on MV  Septic shock with multiorgan injury secondary to pneumococcal pneumonia/bacteremia.   No obvious vegetation on TTE. Persistent severe O2 needs with a great deal of this coming from vent dys-synchrony. Increase FIO2 requirement overnight. Off pressors this morn.  -Chest xray not improving. Febrile this morning on CRRT, increased  WBC.  Plan: - Usual VAP prevention bundle and ARDS protocol PEEP/FiO2 wean - PRN vecuronium pushes to prevent derecruitment, there has been no happy medium with her sedation  - f/u blood cultures from 7/10 to document clearance of bacteremia, NGTD 2 days  - Continue to try to switch off drips to oral sedation meds -  Cefazolin, start date 7/8, anticipated prolonged course given severity of illness - Continue CRRT, appreciate nephrology. - Consulted ID, greatly appreciate the consult  Bipolar Disorder Patient is on Seroquel 150 qhs at home. Will adjust dosing to help with sedation as mentioned above.  - Seroquel 100 mg BID - Sertraline 200 mg   Substance use disorder.  History of polysubstance use disorder.  Makes sedation difficult.   Best practice:  Diet: TF Pain/Anxiety/Delirium protocol (if indicated): N/A VAP protocol (if indicated): Y DVT prophylaxis: Subcu heparin GI prophylaxis: Famotidine Glucose control: Sliding scale insulin Mobility: Bedrest Code Status: Full code Family Communication: Resident updating family  Disposition: ICU  Thurmon Fair, MD PGY2 Internal Medicine     PCCM:  39 yo severe ARDS, strep pna, remains in PCV, low P/F ratio, on CVVHD, MODs. Still febrile   BP (!) 100/37   Pulse 68   Temp (!) 100.4 F (38 C)   Resp 18   Ht 5\' 10"  (1.778 m)   Wt 102.1 kg   SpO2 99%   BMI 32.30 kg/m   Gen: young fm, on vent  Hent: ETT in place  Heart: RRR, s1 s2  Lungs: BL vented breaths  Abd: soft, nt nd   Labs reviewed   A: ARDS  AHRF on MV  Strep PNA  MODS ARF on CVVHD   P: ARDS vent protocol  Overnight rate increased  Permissive hypercapnia  repat abg from the am reviewed  Remains in PCV   Vent Mode: PCV FiO2 (%):  [70 %-100 %] 70 % Set Rate:  [32 bmp-35 bmp] 35 bmp PEEP:  [12 cmH20-14 cmH20] 14 cmH20 Plateau Pressure:  [27 cmH20-29 cmH20] 27 cmH20  Still febrile Case discussed with ID   Continue ancef   This patient is critically ill with multiple organ system failure; which, requires frequent high complexity decision making, assessment, support, evaluation, and titration of therapies. This was completed through the application of advanced monitoring technologies and extensive interpretation of multiple databases. During this encounter critical  care time was devoted to patient care services described in this note for 33 minutes.  10-28-1980, DO [facility] 11/22/2019 10:22 AM

## 2019-11-22 NOTE — Progress Notes (Signed)
Patient ID: Rachael Harvey, female   DOB: 1980-12-15, 39 y.o.   MRN: [id number] S: Desat while supine and bathing overnight.  MV increased due to resp acidosis - f/u ABG improved.  Back on low dose NE.   Yest I/Os 3.5 / 3.2 - UOP 423m, UF 2.7L  O:BP (!) 103/44 (BP Location: Left Arm)   Pulse 68   Temp 100.2 F (37.9 C)   Resp 17   Ht '5\' 10"'  (1.778 m)   Wt 102.1 kg   SpO2 100%   BMI 32.30 kg/m   Intake/Output Summary (Last 24 hours) at 11/22/2019 0831 Last data filed at 11/22/2019 0825 Gross per 24 hour  Intake 3594.89 ml  Output 3198 ml  Net 396.89 ml   Intake/Output: I/O last 3 completed shifts: In: 5054.6 [I.V.:2161.4; NG/GT:2390.8; IV Piggyback:502.4] Out: 44098[Urine:765; OJXBJY:7829] Intake/Output this shift:  Total I/O In: 185.9 [I.V.:95.9; NG/GT:90] Out: 126 [Urine:10; Other:116] Weight change: 1.7 kg Gen: intubated and sedated  CVS: RRR  Resp: occ rhonchi bilaterally Abd: +BS, soft, NT/ND Ext: 1+ edema  Recent Labs  Lab 11/30/2019 1940 11/15/2019 2324 11/19/19 0425 11/19/19 0821 11/19/19 1636 11/19/19 1636 11/20/19 0358 11/20/19 0358 11/20/19 1015 11/20/19 1625 11/21/19 0450 11/21/19 1320 11/21/19 1625 11/22/19 0315 11/22/19 0317  NA 137   < > 141   < > 141   < > 139   < > 139 138 139 139 139 138 137  K 4.0   < > 2.8*   < > 3.9   < > 4.0   < > 4.0 3.9 4.1 4.4 4.4 4.9 4.8  CL 107   < > 104  --  106  --  105  --   --  104 104  --  106 105  --   CO2 19*   < > 25  --  26  --  24  --   --  25 26  --  26 25  --   GLUCOSE 127*   < > 125*  --  134*  --  151*  --   --  142* 130*  --  137* 143*  --   BUN 87*   < > 87*  --  62*  --  51*  --   --  41* 36*  --  34* 34*  --   CREATININE 4.04*   < > 2.14*  --  1.45*  --  1.15*  --   --  1.00 0.85  --  0.87 0.92  --   ALBUMIN 1.9*   < > 1.7*  --  1.7*  --  1.6*  --   --  2.0* 2.5*  --  2.4* 2.5*  --   CALCIUM 8.0*   < > 8.0*  --  8.2*  --  8.3*  --   --  8.4* 8.6*  --  8.4* 8.7*  --   PHOS  --    < > 3.8  --  3.5  --   2.6  --   --  3.1 3.3  --  2.9 3.8  --   AST 75*  --   --   --   --   --   --   --   --   --   --   --   --   --   --   ALT 53*  --   --   --   --   --   --   --   --   --   --   --   --   --   --    < > =  values in this interval not displayed.   Liver Function Tests: Recent Labs  Lab 12/01/2019 1940 11/17/19 1142 11/21/19 0450 11/21/19 1625 11/22/19 0315  AST 75*  --   --   --   --   ALT 53*  --   --   --   --   ALKPHOS 69  --   --   --   --   BILITOT 1.3*  --   --   --   --   PROT 5.2*  --   --   --   --   ALBUMIN 1.9*   < > 2.5* 2.4* 2.5*   < > = values in this interval not displayed.   No results for input(s): LIPASE, AMYLASE in the last 168 hours. No results for input(s): AMMONIA in the last 168 hours. CBC: Recent Labs  Lab 11/26/2019 1940 12/01/2019 2324 11/18/19 0519 11/18/19 0520 11/19/19 0425 11/19/19 7078 11/20/19 0358 11/20/19 1015 11/21/19 0450 11/21/19 0450 11/21/19 1320 11/22/19 0315 11/22/19 0317  WBC 18.2*   < > 26.3*   < > 10.1   < > 14.4*  --  14.1*  --   --  18.0*  --   NEUTROABS 16.2*  --   --   --   --   --   --   --   --   --   --   --   --   HGB 11.8*   < > 11.6*   < > 11.7*   < > 10.6*   < > 9.1*   < > 8.8* 9.2* 9.9*  HCT 37.4   < > 35.9*   < > 35.0*   < > 32.9*   < > 29.6*   < > 26.0* 30.2* 29.0*  MCV 89.9   < > 89.1  --  87.3  --  88.4  --  91.6  --   --  94.4  --   PLT 99*   < > 85*   < > 49*   < > 67*  --  89*  --   --  112*  --    < > = values in this interval not displayed.   Cardiac Enzymes: No results for input(s): CKTOTAL, CKMB, CKMBINDEX, TROPONINI in the last 168 hours. CBG: Recent Labs  Lab 11/21/19 1616 11/21/19 1927 11/21/19 2303 11/22/19 0314 11/22/19 0728  GLUCAP 126* 114* 143* 129* 117*    Iron Studies: No results for input(s): IRON, TIBC, TRANSFERRIN, FERRITIN in the last 72 hours. Studies/Results: DG Chest 1 View  Result Date: 11/21/2019 CLINICAL DATA:  39 year old female with ARDS. EXAM: CHEST  1 VIEW COMPARISON:   Chest radiograph dated 11/19/2019. FINDINGS: Endotracheal tube above the carina, enteric tube extends below the diaphragm with tip beyond the inferior margin of the image, and right IJ central venous line with tip over central SVC close to the cavoatrial junction. A right-sided PICC is partially visualized with tip in the region of the right axilla in similar position. Recommend advancing of the PICC line. Diffuse bilateral interstitial densities and confluent airspace opacities similar to prior radiograph. No large pleural effusion or pneumothorax. Stable cardiomediastinal silhouette. No acute osseous pathology. IMPRESSION: 1. Stable support apparatus. 2. Bilateral pulmonary opacities with no significant interval change. Electronically Signed   By: Anner Crete M.D.   On: 11/21/2019 03:39   DG CHEST PORT 1 VIEW  Result Date: 11/21/2019 CLINICAL DATA:  Hypoxia. EXAM: PORTABLE CHEST 1 VIEW COMPARISON:  Most recent radiograph earlier this day. FINDINGS: Endotracheal tube tip remains at the thoracic inlet. Enteric tube below the diaphragm in the stomach. Tip of the right internal jugular central venous catheter unchanged in the SVC. Minimal progression in diffuse bilateral ground-glass and confluent pulmonary opacities. Small right pleural effusion. No pneumothorax. Heart is normal in size. IMPRESSION: 1. Minimal progression in diffuse bilateral ground-glass and confluent pulmonary opacities since earlier this day. Findings suggest aorta ES or multifocal pneumonia. 2. Small right pleural effusion. 3. Stable support apparatus. Electronically Signed   By: Keith Rake M.D.   On: 11/21/2019 21:58   . artificial tears  1 application Both Eyes E1V  . chlorhexidine gluconate (MEDLINE KIT)  15 mL Mouth Rinse BID  . Chlorhexidine Gluconate Cloth  6 each Topical Daily  . clonazePAM  2 mg Per Tube BID  . docusate  100 mg Per Tube BID  . feeding supplement (PROSource TF)  90 mL Per Tube TID  . heparin  5,000  Units Subcutaneous Q8H  . insulin aspart  0-9 Units Subcutaneous Q4H  . mouth rinse  15 mL Mouth Rinse 10 times per day  . midodrine  10 mg Oral TID WC  . oxyCODONE  5 mg Per Tube Q6H  . pantoprazole (PROTONIX) IV  40 mg Intravenous Q24H  . polyethylene glycol  17 g Per Tube Daily  . QUEtiapine  100 mg Per Tube BID  . sertraline  200 mg Per Tube Daily  . sodium chloride flush  10-40 mL Intracatheter Q12H    BMET    Component Value Date/Time   NA 137 11/22/2019 0317   K 4.8 11/22/2019 0317   CL 105 11/22/2019 0315   CO2 25 11/22/2019 0315   GLUCOSE 143 (H) 11/22/2019 0315   BUN 34 (H) 11/22/2019 0315   CREATININE 0.92 11/22/2019 0315   CALCIUM 8.7 (L) 11/22/2019 0315   GFRNONAA >60 11/22/2019 0315   GFRAA >60 11/22/2019 0315   CBC    Component Value Date/Time   WBC 18.0 (H) 11/22/2019 0315   RBC 3.20 (L) 11/22/2019 0315   HGB 9.9 (L) 11/22/2019 0317   HCT 29.0 (L) 11/22/2019 0317   PLT 112 (L) 11/22/2019 0315   MCV 94.4 11/22/2019 0315   MCH 28.8 11/22/2019 0315   MCHC 30.5 11/22/2019 0315   RDW 16.7 (H) 11/22/2019 0315   LYMPHSABS 0.5 (L) 12/09/2019 1940   MONOABS 0.5 12/07/2019 1940   EOSABS 0.0 12/03/2019 1940   BASOSABS 0.0 12/07/2019 1940    Assessment/Plan: 1. AKI- presumably ischemic ATN in setting of severe septic shock due to pneumoncoccal pneumonia. Further complicated by metabolic acidosis and hyperkalemia.  1. Initiated CRRT 11/18/19 given ongoing sepsis and metabolic acidosis; given pressor requirement cont for now 2. Net + 6.2L for admission, no frank edema on CXR but did desat while supine overnight.  ^ UF goal to net neg 100-171m/hr.   3. All fluids:  4K/2.5Ca 4. Will plan to hold CRRT when hemodynamically stable.  UOP 4572myesterday encouraging but will likely need continued RRT over short term 2. VDRF- due to multifocal pneumonia complicated by ARDS- per PCCM 3. Septic shock due to pneumococcal pneumonia with bacteremia. Improving 4. Metabolic  acidosis- due to #1. improved with CRRT  5. Hypocalcemia- repleted with IV calcium gluconate, acceptable today. 6. Anemia - transfusion per primary PRN, currently in 9s.  LiJannifer HickD [facility] Pager [phone]

## 2019-11-22 NOTE — Progress Notes (Signed)
ABG results given to Dr. Tonia Brooms. No new orders received at this time. RT will continue to monitor.

## 2019-11-22 NOTE — Progress Notes (Signed)
eLink Physician-Brief Progress Note Patient Name: Rachael Harvey DOB: August 25, 1980 MRN: [id number]   Date of Service  11/22/2019  HPI/Events of Note  ABG on 90%/PCV 18/Rate 32/P 14 = 7.234/67.5/140/28.5.  eICU Interventions  Plan: 1. Increase Rate to 35. 2. Repeat ABG at 7:30 AM.     Intervention Category Major Interventions: Acid-Base disturbance - evaluation and management;Respiratory failure - evaluation and management  Lenell Antu 11/22/2019, 3:37 AM

## 2019-11-22 NOTE — Consult Note (Signed)
[place] for Infectious Disease       Reason for Consult: fever    Referring Physician: Dr. Valeta Harms  Principal Problem:   Septic shock ([place]) Active Problems:   Pressure injury of skin   ARDS (adult respiratory distress syndrome) ([place])   AKI (acute kidney injury) ([place])   Acute respiratory failure with hypercapnia (H[facility])   On mechanically assisted ventilation ([facility])   High anion gap metabolic acidosis   Pneumococcal bacteremia   . artificial tears  1 application Both Eyes U9W  . chlorhexidine gluconate (MEDLINE KIT)  15 mL Mouth Rinse BID  . Chlorhexidine Gluconate Cloth  6 each Topical Daily  . clonazePAM  2 mg Per Tube BID  . docusate  100 mg Per Tube BID  . feeding supplement (PROSource TF)  90 mL Per Tube TID  . heparin  5,000 Units Subcutaneous Q8H  . insulin aspart  0-9 Units Subcutaneous Q4H  . mouth rinse  15 mL Mouth Rinse 10 times per day  . midodrine  10 mg Per Tube TID WC  . oxyCODONE  5 mg Per Tube Q6H  . pantoprazole (PROTONIX) IV  40 mg Intravenous Q24H  . polyethylene glycol  17 g Per Tube Daily  . QUEtiapine  100 mg Per Tube BID  . sertraline  200 mg Per Tube Daily  . sodium chloride flush  10-40 mL Intracatheter Q12H    Recommendations: Will change to penicillin Repeat blood cultures Continue supportive care  Assessment: She has multifocal pneumonia and now with ARDS with a known organism of Strep pneumonia that is pansensitive.  With a known organism and appropriate clinical syndrome of pneumonia with Strep pneumoniae, I would continue to target that pathogen and no indication for broadening, though I will change to penicillin for more directed therapy.    Antibiotics: cefazolin  HPI: Rachael Harvey is a 39 y.o. female with anxiety and depression who initially presented to [facility] with hypoxia via EMS.  She requried intubation and CXR with bilateral multifocal pneumonia.  Currently the patient remains intubated.  WBC initially here  was 22.1 and decreased down to 10.1 though is up to 18 today.  Tmax of 100.4 on CRRT.  Has been on pressor support but off since this am.  History from the chart. Vent setting with high FiO2 and high PEEP c/w ARDS.  Her lines are relatively new and I do not feel lines need to be removed at this time but will repeat the blood cultures.    Review of Systems:  Unable to be assessed due to patient factors    No past medical history on file.  Social History   Tobacco Use  . Smoking status: Not on file  Substance Use Topics  . Alcohol use: Not on file  . Drug use: Not on file    No family history on file.  Allergies  Allergen Reactions  . Peach Flavor   . Shellfish Allergy   history from the chart, otherwise unobtainable  Physical Exam: Constitutional: intubated Vitals:   11/22/19 0845 11/22/19 0900  BP:  (!) 100/37  Pulse: 70 68  Resp: 20 18  Temp: (!) 100.4 F (38 C) (!) 100.4 F (38 C)  SpO2: 98% 99%   EYES: anicteric ENMT: +ET Cardiovascular: Cor Tachy Respiratory: clear; GI: Bowel sounds are normal, liver is not enlarged, spleen is not enlarged Musculoskeletal: no pedal edema noted Skin: negatives: no rash Right IJ line with no surrounding erythema  Lab Results  Component Value Date   WBC 18.0 (H) 11/22/2019   HGB 9.5 (L) 11/22/2019   HCT 28.0 (L) 11/22/2019   MCV 94.4 11/22/2019   PLT 112 (L) 11/22/2019    Lab Results  Component Value Date   CREATININE 0.92 11/22/2019   BUN 34 (H) 11/22/2019   NA 138 11/22/2019   K 4.6 11/22/2019   CL 105 11/22/2019   CO2 25 11/22/2019    Lab Results  Component Value Date   ALT 53 (H) 11/15/2019   AST 75 (H) 12/08/2019   ALKPHOS 69 11/14/2019     Microbiology: Recent Results (from the past 240 hour(s))  MRSA PCR Screening     Status: None   Collection Time: 11/15/2019  7:51 PM   Specimen: Nasal Mucosa; Nasopharyngeal  Result Value Ref Range Status   MRSA by PCR NEGATIVE NEGATIVE Final    Comment:        The  GeneXpert MRSA Assay (FDA approved for NASAL specimens only), is one component of a comprehensive MRSA colonization surveillance program. It is not intended to diagnose MRSA infection nor to guide or monitor treatment for MRSA infections. Performed at [facility], [place] [address] [place], [place] [address]   Culture, blood (routine x 2)     Status: None (Preliminary result)   Collection Time: 11/19/19  8:14 AM   Specimen: BLOOD  Result Value Ref Range Status   Specimen Description BLOOD BLOOD RIGHT HAND  Final   Special Requests   Final    BOTTLES DRAWN AEROBIC AND ANAEROBIC Blood Culture results may not be optimal due to an inadequate volume of blood received in culture bottles   Culture   Final    NO GROWTH 2 DAYS Performed at [facility], [place] [address] [place], [place] [address]    Report Status PENDING  Incomplete  Culture, respiratory (non-expectorated)     Status: None   Collection Time: 11/19/19  9:47 AM   Specimen: Tracheal Aspirate; Respiratory  Result Value Ref Range Status   Specimen Description TRACHEAL ASPIRATE  Final   Special Requests NONE  Final   Gram Stain   Final    RARE WBC PRESENT, PREDOMINANTLY MONONUCLEAR NO ORGANISMS SEEN    Culture   Final    RARE Consistent with normal respiratory flora. Performed at [facility], [place] [address] [place], [place] [address]    Report Status 11/21/2019 FINAL  Final  Culture, blood (routine x 2)     Status: None (Preliminary result)   Collection Time: 11/19/19  5:10 PM   Specimen: BLOOD  Result Value Ref Range Status   Specimen Description BLOOD BLOOD LEFT HAND  Final   Special Requests   Final    BOTTLES DRAWN AEROBIC AND ANAEROBIC Blood Culture adequate volume   Culture   Final    NO GROWTH 2 DAYS Performed at [facility], [place] [address] [place], [place] [address]    Report Status PENDING  Incomplete    Thayer Headings, [place] for Infectious Disease [place] [email or web address] 11/22/2019, 10:09 AM

## 2019-11-23 ENCOUNTER — Inpatient Hospital Stay (HOSPITAL_COMMUNITY): Payer: Medicaid Other

## 2019-11-23 LAB — POCT I-STAT 7, (LYTES, BLD GAS, ICA,H+H)
Acid-base deficit: 1 mmol/L (ref 0.0–2.0)
Acid-base deficit: 3 mmol/L — ABNORMAL HIGH (ref 0.0–2.0)
Bicarbonate: 26.3 mmol/L (ref 20.0–28.0)
Bicarbonate: 27.5 mmol/L (ref 20.0–28.0)
Calcium, Ion: 1.3 mmol/L (ref 1.15–1.40)
Calcium, Ion: 1.3 mmol/L (ref 1.15–1.40)
HCT: 29 % — ABNORMAL LOW (ref 36.0–46.0)
HCT: 31 % — ABNORMAL LOW (ref 36.0–46.0)
Hemoglobin: 9.9 g/dL — ABNORMAL LOW (ref 12.0–15.0)
Hemoglobin: 10.5 g/dL — ABNORMAL LOW (ref 12.0–15.0)
O2 Saturation: 87 %
O2 Saturation: 99 %
Patient temperature: 36.8
Patient temperature: 98.3
Potassium: 5.1 mmol/L (ref 3.5–5.1)
Potassium: 6.1 mmol/L — ABNORMAL HIGH (ref 3.5–5.1)
Sodium: 136 mmol/L (ref 135–145)
Sodium: 133 mmol/L — ABNORMAL LOW (ref 135–145)
TCO2: 28 mmol/L (ref 22–32)
TCO2: 30 mmol/L (ref 22–32)
pCO2 arterial: 52.2 mmHg — ABNORMAL HIGH (ref 32.0–48.0)
pCO2 arterial: 82 mmHg (ref 32.0–48.0)
pH, Arterial: 7.309 — ABNORMAL LOW (ref 7.350–7.450)
pH, Arterial: 7.132 — CL (ref 7.350–7.450)
pO2, Arterial: 59 mmHg — ABNORMAL LOW (ref 83.0–108.0)
pO2, Arterial: 201 mmHg — ABNORMAL HIGH (ref 83.0–108.0)

## 2019-11-23 LAB — RENAL FUNCTION PANEL
Albumin: 2.2 g/dL — ABNORMAL LOW (ref 3.5–5.0)
Albumin: 2.1 g/dL — ABNORMAL LOW (ref 3.5–5.0)
Anion gap: 9 (ref 5–15)
Anion gap: 8 (ref 5–15)
BUN: 35 mg/dL — ABNORMAL HIGH (ref 6–20)
BUN: 36 mg/dL — ABNORMAL HIGH (ref 6–20)
CO2: 22 mmol/L (ref 22–32)
CO2: 25 mmol/L (ref 22–32)
Calcium: 8.6 mg/dL — ABNORMAL LOW (ref 8.9–10.3)
Calcium: 8.8 mg/dL — ABNORMAL LOW (ref 8.9–10.3)
Chloride: 104 mmol/L (ref 98–111)
Chloride: 100 mmol/L (ref 98–111)
Creatinine, Ser: 1.09 mg/dL — ABNORMAL HIGH (ref 0.44–1.00)
Creatinine, Ser: 1.28 mg/dL — ABNORMAL HIGH (ref 0.44–1.00)
GFR calc Af Amer: >60 mL/min (ref >60)
GFR calc Af Amer: >60 mL/min (ref >60)
GFR calc non Af Amer: >60 mL/min (ref >60)
GFR calc non Af Amer: 53 mL/min — ABNORMAL LOW (ref >60)
Glucose, Bld: 157 mg/dL — ABNORMAL HIGH (ref 70–99)
Glucose, Bld: 179 mg/dL — ABNORMAL HIGH (ref 70–99)
Phosphorus: 4.3 mg/dL (ref 2.5–4.6)
Phosphorus: 7 mg/dL — ABNORMAL HIGH (ref 2.5–4.6)
Potassium: 5.2 mmol/L — ABNORMAL HIGH (ref 3.5–5.1)
Potassium: 5.9 mmol/L — ABNORMAL HIGH (ref 3.5–5.1)
Sodium: 135 mmol/L (ref 135–145)
Sodium: 133 mmol/L — ABNORMAL LOW (ref 135–145)

## 2019-11-23 LAB — POCT ACTIVATED CLOTTING TIME
Activated Clotting Time: 191 seconds
Activated Clotting Time: 191 seconds
Activated Clotting Time: 191 seconds
Activated Clotting Time: 197 seconds
Activated Clotting Time: 197 seconds
Activated Clotting Time: 202 seconds
Activated Clotting Time: 219 seconds

## 2019-11-23 LAB — CBC
HCT: 30.7 % — ABNORMAL LOW (ref 36.0–46.0)
Hemoglobin: 9.3 g/dL — ABNORMAL LOW (ref 12.0–15.0)
MCH: 29.1 pg (ref 26.0–34.0)
MCHC: 30.3 g/dL (ref 30.0–36.0)
MCV: 95.9 fL (ref 80.0–100.0)
Platelets: 134 10*3/uL — ABNORMAL LOW (ref 150–400)
RBC: 3.2 MIL/uL — ABNORMAL LOW (ref 3.87–5.11)
RDW: 16.7 % — ABNORMAL HIGH (ref 11.5–15.5)
WBC: 18.7 10*3/uL — ABNORMAL HIGH (ref 4.0–10.5)
nRBC: 0.2 % (ref 0.0–0.2)

## 2019-11-23 LAB — APTT: aPTT: 121 seconds — ABNORMAL HIGH (ref 24–36)

## 2019-11-23 LAB — TRIGLYCERIDES: Triglycerides: 100 mg/dL (ref <150)

## 2019-11-23 LAB — MAGNESIUM: Magnesium: 2.8 mg/dL — ABNORMAL HIGH (ref 1.7–2.4)

## 2019-11-23 LAB — GLUCOSE, CAPILLARY
Glucose-Capillary: 149 mg/dL — ABNORMAL HIGH (ref 70–99)
Glucose-Capillary: 112 mg/dL — ABNORMAL HIGH (ref 70–99)
Glucose-Capillary: 151 mg/dL — ABNORMAL HIGH (ref 70–99)
Glucose-Capillary: 131 mg/dL — ABNORMAL HIGH (ref 70–99)
Glucose-Capillary: 160 mg/dL — ABNORMAL HIGH (ref 70–99)

## 2019-11-23 MED ORDER — POLYVINYL ALCOHOL 1.4 % OP SOLN
1.0000 [drp] | Freq: Four times a day (QID) | OPHTHALMIC | Status: DC | PRN
  Filled 2019-11-23: qty 15

## 2019-11-23 MED ORDER — STERILE WATER FOR INJECTION IV SOLN
INTRAVENOUS | Status: DC
  Filled 2019-11-23: qty 850

## 2019-11-23 MED ORDER — DIPHENHYDRAMINE HCL 50 MG/ML IJ SOLN: 25.0000 mg | INTRAMUSCULAR | Status: DC | PRN

## 2019-11-23 MED ORDER — GLYCOPYRROLATE 1 MG PO TABS: 1.0000 mg | ORAL_TABLET | ORAL | Status: DC | PRN

## 2019-11-23 MED ORDER — GLYCOPYRROLATE 0.2 MG/ML IJ SOLN: 0.2000 mg | INTRAMUSCULAR | Status: DC | PRN

## 2019-11-23 MED ORDER — METHYLPREDNISOLONE SODIUM SUCC 125 MG IJ SOLR
40.0000 mg | Freq: Three times a day (TID) | INTRAMUSCULAR | Status: DC
  Administered 2019-11-23: 40 mg via INTRAVENOUS
  Filled 2019-11-23: qty 2

## 2019-11-23 MED ORDER — SODIUM BICARBONATE 8.4 % IV SOLN: 100.0000 meq | Freq: Once | INTRAVENOUS | Status: AC

## 2019-11-23 MED ORDER — ROCURONIUM BROMIDE 50 MG/5ML IV SOLN
70.0000 mg | Freq: Once | INTRAVENOUS | Status: AC
  Administered 2019-11-23: 70 mg via INTRAVENOUS
  Filled 2019-11-23: qty 7

## 2019-11-23 MED ORDER — ROCURONIUM BROMIDE 10 MG/ML (PF) SYRINGE
PREFILLED_SYRINGE | INTRAVENOUS | Status: AC
  Filled 2019-11-23: qty 10

## 2019-11-23 MED ORDER — SODIUM BICARBONATE 8.4 % IV SOLN
INTRAVENOUS | Status: AC
  Administered 2019-11-23: 100 meq via INTRAVENOUS
  Filled 2019-11-23: qty 100

## 2019-11-24 LAB — CULTURE, BLOOD (ROUTINE X 2)
Culture: NO GROWTH
Culture: NO GROWTH
Special Requests: ADEQUATE

## 2019-11-27 LAB — CULTURE, BLOOD (ROUTINE X 2)
Culture: NO GROWTH
Culture: NO GROWTH
Special Requests: ADEQUATE
Special Requests: ADEQUATE

## 2019-12-11 NOTE — Significant Event (Signed)
Mother (POA), daughter and niece at bedside. We discussed patient's critical condition. Mother has spoken with primary team during the day and wishes to transition patient to Comfort Care. We discussed the plan to stop dialysis and medications and compassionate extubation. Mother expressed understanding and agreed to plan.  Code status updated and withdrawal of care orders placed.  Mechele Collin, M.D. [facility] Pulmonary/Critical Care Medicine 12/16/2019 8:26 PM

## 2019-12-11 NOTE — Progress Notes (Signed)
Pt transitioned to comfort care per orders. CRRT and vasopressors discontinued. Family at bedside. Provided emotional support to family. TOD 2143-09-19. Pronounced by this RN and Casimiro Needle, RN.  Wasted 240 mL fentanyl gtt and 21mL with Casimiro Needle, Charity fundraiser.

## 2019-12-11 NOTE — Progress Notes (Signed)
[facility] Progress Note   Subjective:   Worsening oxygenation - now on FiO2 100% and PEEP 14.  Remains on pressors.  Objective Vitals:   2019-12-08 1114 08-Dec-2019 1115 December 08, 2019 1130 12-08-19 1150  BP:    113/66  Pulse:  75 72 72  Resp:  20 20 (!) 37  Temp: 99.1 F (37.3 C) 99.1 F (37.3 C) 99.1 F (37.3 C)   TempSrc:      SpO2:  100% 100% 100%  Weight:      Height:       Physical Exam Gen: intubated and sedated  CVS: RRR  Resp: occ rhonchi bilaterally Abd: +BS, soft, NT/ND Ext: 1+ edema  Additional Objective Labs: Basic Metabolic Panel: Recent Labs  Lab 11/22/19 0315 11/22/19 0317 11/22/19 1625 Dec 08, 2019 0300 12-08-19 0837  NA 138   < > 136 135 136  K 4.9   < > 4.8 5.2* 5.1  CL 105  --  103 104  --   CO2 25  --  25 22  --   GLUCOSE 143*  --  131* 157*  --   BUN 34*  --  35* 35*  --   CREATININE 0.92  --  1.06* 1.09*  --   CALCIUM 8.7*  --  8.5* 8.6*  --   PHOS 3.8  --  3.6 4.3  --    < > = values in this interval not displayed.   Liver Function Tests: Recent Labs  Lab 12/03/2019 1940 11/17/19 1142 11/22/19 0315 11/22/19 1625 12/08/19 0300  AST 75*  --   --   --   --   ALT 53*  --   --   --   --   ALKPHOS 69  --   --   --   --   BILITOT 1.3*  --   --   --   --   PROT 5.2*  --   --   --   --   ALBUMIN 1.9*   < > 2.5* 2.3* 2.2*   < > = values in this interval not displayed.   No results for input(s): LIPASE, AMYLASE in the last 168 hours. CBC: Recent Labs  Lab 11/15/2019 1940 11/19/2019 2324 11/19/19 0425 11/19/19 2248 11/20/19 0358 11/20/19 1015 11/21/19 0450 11/21/19 1320 11/22/19 0315 11/22/19 0317 11/22/19 0828 December 08, 2019 0300 December 08, 2019 0837  WBC 18.2*   < > 10.1   < > 14.4*  --  14.1*  --  18.0*  --   --  18.7*  --   NEUTROABS 16.2*  --   --   --   --   --   --   --   --   --   --   --   --   HGB 11.8*   < > 11.7*   < > 10.6*   < > 9.1*   < > 9.2*   < > 9.5* 9.3* 9.9*  HCT 37.4   < > 35.0*   < > 32.9*   < > 29.6*   < > 30.2*    < > 28.0* 30.7* 29.0*  MCV 89.9   < > 87.3  --  88.4  --  91.6  --  94.4  --   --  95.9  --   PLT 99*   < > 49*   < > 67*  --  89*  --  112*  --   --  134*  --    < > =  values in this interval not displayed.   Blood Culture    Component Value Date/Time   SDES BLOOD LEFT HAND 11/22/2019 1120   SPECREQUEST  11/22/2019 1120    BOTTLES DRAWN AEROBIC ONLY Blood Culture adequate volume   CULT  11/22/2019 1120    NO GROWTH < 24 HOURS Performed at [facility], [place] [address], [place], [place] [address]    REPTSTATUS PENDING 11/22/2019 1120    Cardiac Enzymes: No results for input(s): CKTOTAL, CKMB, CKMBINDEX, TROPONINI in the last 168 hours. CBG: Recent Labs  Lab 11/22/19 1912 11/22/19 2326 2019/12/15 0304 12-15-19 0725 12/15/2019 1112  GLUCAP 118* 137* 149* 112* 151*   Iron Studies: No results for input(s): IRON, TIBC, TRANSFERRIN, FERRITIN in the last 72 hours. _0 @ Studies/Results: DG Chest Port 1 View  Result Date: 12/15/19 CLINICAL DATA:  Hypoxic respiratory failure. ARDS. EXAM: PORTABLE CHEST 1 VIEW COMPARISON:  11/21/2019 FINDINGS: The endotracheal tube, NG tube and right IJ catheters are stable. The cardiac silhouette, mediastinal and hilar contours are within normal limits and unchanged. Diffuse asymmetric interstitial and airspace process in the lungs. No large pleural effusion or pneumothorax. IMPRESSION: 1. Stable support apparatus. 2. Persistent diffuse interstitial and airspace process. Electronically Signed   By: Marijo Sanes M.D.   On: Dec 15, 2019 08:49   DG CHEST PORT 1 VIEW  Result Date: 11/21/2019 CLINICAL DATA:  Hypoxia. EXAM: PORTABLE CHEST 1 VIEW COMPARISON:  Most recent radiograph earlier this day. FINDINGS: Endotracheal tube tip remains at the thoracic inlet. Enteric tube below the diaphragm in the stomach. Tip of the right internal jugular central venous catheter unchanged in the SVC. Minimal progression in diffuse bilateral ground-glass and  confluent pulmonary opacities. Small right pleural effusion. No pneumothorax. Heart is normal in size. IMPRESSION: 1. Minimal progression in diffuse bilateral ground-glass and confluent pulmonary opacities since earlier this day. Findings suggest aorta ES or multifocal pneumonia. 2. Small right pleural effusion. 3. Stable support apparatus. Electronically Signed   By: Keith Rake M.D.   On: 11/21/2019 21:58   Medications: .  prismasol BGK 4/2.5 500 mL/hr at Dec 15, 2019 0827  .  prismasol BGK 4/2.5 300 mL/hr at 11/22/19 2246  . sodium chloride    . sodium chloride Stopped (12-15-2019 1307)  . dexmedetomidine (PRECEDEX) IV infusion 1.2 mcg/kg/hr (2019-12-15 1400)  . feeding supplement (VITAL 1.5 CAL) 1,000 mL (12/15/2019 0259)  . fentaNYL infusion INTRAVENOUS 200 mcg/hr (Dec 15, 2019 1400)  . heparin 10,000 units/ 20 mL infusion syringe 1,600 Units/hr (2019-12-15 1400)  . midazolam 5 mg/hr (12/15/2019 1403)  . norepinephrine (LEVOPHED) Adult infusion 5 mcg/min (December 15, 2019 1400)  . pencillin G potassium IV 4 Million Units (Dec 15, 2019 1400)  . prismasol BGK 4/2.5 1,500 mL/hr at 12/15/19 1051   . artificial tears  1 application Both Eyes K9T  . chlorhexidine gluconate (MEDLINE KIT)  15 mL Mouth Rinse BID  . Chlorhexidine Gluconate Cloth  6 each Topical Daily  . clonazePAM  2 mg Per Tube BID  . docusate  100 mg Per Tube BID  . feeding supplement (PROSource TF)  90 mL Per Tube TID  . heparin  5,000 Units Subcutaneous Q8H  . insulin aspart  0-9 Units Subcutaneous Q4H  . mouth rinse  15 mL Mouth Rinse 10 times per day  . methylPREDNISolone (SOLU-MEDROL) injection  40 mg Intravenous Q8H  . midodrine  10 mg Per Tube TID WC  . oxyCODONE  5 mg Per Tube Q6H  . pantoprazole (PROTONIX) IV  40 mg Intravenous Q24H  .  polyethylene glycol  17 g Per Tube Daily  . QUEtiapine  100 mg Per Tube BID  . sertraline  200 mg Per Tube Daily  . sodium chloride flush  10-40 mL Intracatheter Q12H    Assessment/Plan: 1. AKI-  presumably ischemic ATN in setting of severe septic shock due to pneumoncoccal pneumonia. Further complicated by metabolic acidosis and hyperkalemia.  1. InitiatedCRRT7/9/21given ongoing sepsis and metabolic acidosis; given pressor requirement and oliguria (156m) cont for now 2. Net + 4.8L for admission, though net neg 7666myesterday.  In light of worsening hypoxia  ^ UF goal to net neg [id number] if hemodynamics allow. 3. All fluids:  4K/2.5Ca 2. VDRF- due to multifocal pneumonia complicated by ARDS- per PCCM 3. Septic shock due to pneumococcal pneumonia with bacteremia.  4. Metabolic acidosis- due to #1. improved with CRRT  5. Anemia - transfusion per primary PRN, currently in 9s.   LinJannifer Hick 7/107-18-2021:10 PM  [facility] Pager: [phone]

## 2019-12-11 NOTE — Progress Notes (Signed)
ABG results given to Dr. Tonia Brooms. No new orders received at this time. Pt turned to 100% FiO2 due to low PaO2. RN notified of changes, RT will continue to monitor.

## 2019-12-11 NOTE — Progress Notes (Signed)
Nutrition Follow-up / Consult  DOCUMENTATION CODES:   Not applicable  INTERVENTION:   Continue TF via OG tube: Vital 1.5 at 50 ml/h Prosource TF 90 ml TID  Provides 2040 kcal, 147 gm protein, 917 ml free water daily.  NUTRITION DIAGNOSIS:   Inadequate oral intake related to inability to eat as evidenced by NPO status.  Ongoing   GOAL:   Provide needs based on ASPEN/SCCM guidelines  Met with TF  MONITOR:   Vent status, Labs, Weight trends, TF tolerance, Skin, I & O's  REASON FOR ASSESSMENT:   Ventilator, Consult Enteral/tube feeding initiation and management  ASSESSMENT:   39 year old female who presented on 7/07 from [facility] with acute hypoxic/hypercapnic respiratory failure, ARDS, AKI. PMH of anxiety, depression, gastric bypass surgery. Pt required intubation and was admitted with multifocal pneumonia.  Poor prognosis noted. Increasing volume removal with CRRT. Plans to begin proning today. Research supports continuing gastric TF at goal rate while proning. If intolerance occurs, could place postpyloric Cortrak tube.   Patient remains intubated on ventilator support. MV: 13.3 L/min Temp (24hrs), Avg:99.3 F (37.4 C), Min:98.2 F (36.8 C), Max:100.9 F (38.3 C)   Currently receiving Vital 1.5 at 50 ml/h with Prosource TF 90 ml TID to provide 2040 kcal, 147 gm protein, 917 ml free water daily. Tolerating well to meet 100% of estimated nutrition needs.  Labs reviewed.  CBG: [phone]  Medications reviewed and include fentanyl, precedex, levophed. IVF: NS at 10 ml/h  Admission weight 97 kg, up to 102.8 kg today. I/O +5.2 L   Diet Order:   Diet Order            Diet NPO time specified  Diet effective now                 EDUCATION NEEDS:   No education needs have been identified at this time  Skin:  Skin Assessment: Skin Integrity Issues: Skin Integrity Issues:: Stage I Stage I: coccyx  Last BM:  11/19/19  Height:   Ht  Readings from Last 1 Encounters:  11/27/2019 '5\' 10"'  (1.778 m)    Weight:   Wt Readings from Last 1 Encounters:  December 10, 2019 102.8 kg    Ideal Body Weight:  68.2 kg  BMI:  Body mass index is 32.52 kg/m.  Estimated Nutritional Needs:   Kcal:  2052  Protein:  136-150 grams  Fluid:  >/= 2.0 L    Lucas Mallow, RD, LDN, CNSC Please refer to Amion for contact information.

## 2019-12-11 NOTE — Progress Notes (Signed)
ACT results for 0814 was 191

## 2019-12-11 NOTE — Procedures (Signed)
Extubation Procedure Note  Patient Details:   Name: Rachael Harvey DOB: 1980/05/24 MRN: [id number]   Airway Documentation:    Vent end date: (not recorded) Vent end time: (not recorded)   Evaluation  O2 sats: currently acceptable Complications: No apparent complications Patient did tolerate procedure well. Bilateral Breath Sounds: Rhonchi, Diminished   No   Comfort extubation.  Hiram Comber 11/28/2019, 9:33 PM

## 2019-12-11 NOTE — Progress Notes (Addendum)
NAME:  Rachael Harvey, MRN:  [id number], DOB:  17-Sep-1980, LOS: 7 ADMISSION DATE:  2019/12/02,   CHIEF COMPLAINT:  Shortness of breath  Brief History   39 year old female was transferred from outside hospital with acute hypoxic/hypercapnic respiratory failure, ARDS and acute kidney injury  History of present illness   39 year old female with history of anxiety and depression who was transferred from [facility] for worsening respiratory failure. Patient is intubated so most of the history is taken from patient's chart, as per chart patient was not feeling well for last few days with a cough, shortness of breath and fever.  When EMS was called patient was noted to be hypoxic into the 60s, she was put on high flow and was transferred to [facility] emergency department, where she was put on BiPAP but she continued to remain hypoxic so decision was made to intubate, she was admitted in the hospital with acute hypoxic respiratory failure due to multifocal pneumonia. Over the last 2 days she has been getting IV antibiotics, but her condition continued to deteriorate with hypoxia, hypercapnia and acute metabolic acidosis associated with acute kidney injury so she was transferred to [facility] for further management.  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17  Significant Hospital Events   7/7 Transferred to [facility] from [facility] 7/8 renal function declining--nephrology consulted  Consults:  nephrology  Procedures:  CVL 7/7 >> Arterial line 7/7 >>  Significant Diagnostic Tests:  7/7 CXR>>diffuse b/l infiltrates 7/12 CXR: Bilateral ground glass and confluene  Micro Data:  7/5 Blood culture strep pneumo 7/7 >> 7/7 MRSA PCR Swab: NG 7/10 Sputum culture: NGTD 7/10 Blood cultures: NGTD @ 4days 7/13 Blood cultures   Antimicrobials:  7/7 Vancomycin >>7/7 7/7 Cefepime>>7/8 7/8 Cefazolin >>7/13 7/13 Penicillin G>   Interim  history/subjective:  Overnight desating and required eLink physician to be contacted for vent adjustments. Increased PEEP and FIO2.   Objective   Blood pressure 125/63, pulse 67, temperature 98.2 F (36.8 C), resp. rate 17, height 5\' 10"  (1.778 m), weight 102.8 kg, SpO2 100 %.    Vent Mode: PCV FiO2 (%):  [10 %-100 %] 90 % Set Rate:  [35 bmp] 35 bmp PEEP:  [14 cmH20] 14 cmH20   Intake/Output Summary (Last 24 hours) at 12/01/2019 0808 Last data filed at 11/24/2019 0700 Gross per 24 hour  Intake 3993.69 ml  Output 4774 ml  Net -780.31 ml   Filed Weights   11/21/19 0449 11/22/19 0500 11/25/2019 0500  Weight: 100.4 kg 102.1 kg 102.8 kg    Examination: GEN: ill appearing woman on vent HEENT: ETT in place , trachea midline CV: RRR, ext warm PULM: BL vent breath sounds  GI: Soft, +BS EXT: no deformities NEURO: Sedated, corneal reflex intact, Gag reflex, PERRL  PSYCH: RASS -5 SKIN: warm, dry   Labs Reviewed:  CBG at goal  ,  Mg 2.8 , K 5.2 . WBC 18.7 , TG 100  Imaging: Chest xray pending this am  Resolved Hospital Problem list     Assessment & Plan:  ARDS on MV  Septic shock with multiorgan injury secondary to pneumococcal pneumonia/bacteremia.   No obvious vegetation on TTE. Persistent severe O2 needs with a great deal of this coming from vent dys-synchrony. On levo this morning.   - Greatly appreciate ID consult, recommended Pencillin G. Afebrile for 24hrs . WBC stable  Plan: - Usual VAP prevention bundle and ARDS protocol PEEP/FiO2 wean - PRN vecuronium  pushes to prevent derecruitment, she desated last night with push.  - f/u blood cultures from 7/10 to document clearance of bacteremia, NGTD 4 days  - Continue to try to switch off drips to oral sedation meds - Appreciate ID consult,  continue Penicillin G, anticipated prolonged course given severity of    illness - Continue CRRT, appreciate nephrology.  Bipolar Disorder Patient is on Seroquel 150 qhs at home. Will  adjust dosing to help with sedation as mentioned above.  - Seroquel 100 mg BID - Sertraline 200 mg   Substance use disorder.  History of polysubstance use disorder.  Makes sedation difficult.   Best practice:  Diet: TF Pain/Anxiety/Delirium protocol (if indicated): N/A VAP protocol (if indicated): Y DVT prophylaxis: Subcu heparin GI prophylaxis: Protonix  Glucose control: Sliding scale insulin Mobility: Bedrest Code Status: Full code Family Communication: Resident updating mother Disposition: ICU  Thurmon Fair, MD PGY2 Internal Medicine     PCCM:  39 yo MODS, Septic shock from strep bacteremia, ARDs,  ARF on CVVHD   BP 127/63    Pulse 73    Temp 99.1 F (37.3 C)    Resp (!) 21    Ht 5\' 10"  (1.778 m)    Wt 102.8 kg    SpO2 98%    BMI 32.52 kg/m   Gen: young fm, intubated on MV  HENT: ett, sedated  Heart: RRR, s1 s2 Lungs: BL vented breaths  Abd: soft, nt nd   Labs: reviewed   A:  Septic shock  MODS ARDS, AHRF on MV  Strep bactermia  - I think her prognosis is poor. She has been very difficult to ventilate.  - Her ABG is worse this morning.   P: Increasing pull on cvvhd today  abx changed yesterday  Appreciate ID input Plan possibly to prone.  Continue cvvhd   This patient is critically ill with multiple organ system failure; which, requires frequent high complexity decision making, assessment, support, evaluation, and titration of therapies. This was completed through the application of advanced monitoring technologies and extensive interpretation of multiple databases. During this encounter critical care time was devoted to patient care services described in this note for 34 minutes.  , DO [facility] November 26, 2019 11:44 AM

## 2019-12-11 NOTE — Progress Notes (Signed)
Dr.Icard called patient's mother's phone number and spoke to patient step sister. He informed her patient was declining. Patient's mother, Annamaria Boots, called back around [phone]. She was updated the patient is declining and likely will not survive the day. She ask for patient to remain full code until they arrive. She is with her husband and they are coming from 2 hours away.   Thurmon Fair, MD PGY2 Internal Medicine

## 2019-12-11 NOTE — Progress Notes (Addendum)
[facility]   Reason for visit: Follow up on pneumonia  Interval History: she continues on the vent, increased FiO2 due to hypoxia on ABG.  No acute events otherwise. CXR unchanged Penicillin day 2 Total antibiotics day 8  Physical Exam: Constitutional:  Vitals:   12/04/2019 0830 12/04/2019 1114  BP:    Pulse: 73   Resp: (!) 21   Temp: 98.2 F (36.8 C) 99.1 F (37.3 C)  SpO2: 98%    patient appears in NAD HENT: +ET Respiratory: on vent Cardiovascular: RRR GI: soft, nt, nd  Review of Systems: Unable to be assessed due to patient factors  Lab Results  Component Value Date   WBC 18.7 (H) 11/14/2019   HGB 9.9 (L) 11/20/2019   HCT 29.0 (L) 11/28/2019   MCV 95.9 12/09/2019   PLT 134 (L) 11/21/2019    Lab Results  Component Value Date   CREATININE 1.09 (H) 11/24/2019   BUN 35 (H) 12/03/2019   NA 136 12/01/2019   K 5.1 11/13/2019   CL 104 11/18/2019   CO2 22 12/06/2019    Lab Results  Component Value Date   ALT 53 (H) 17-Nov-2019   AST 75 (H) 11/17/2019   ALKPHOS 69 11/17/19     Microbiology: Recent Results (from the past 240 hour(s))  MRSA PCR Screening     Status: None   Collection Time: 11/17/2019  7:51 PM   Specimen: Nasal Mucosa; Nasopharyngeal  Result Value Ref Range Status   MRSA by PCR NEGATIVE NEGATIVE Final    Comment:        The GeneXpert MRSA Assay (FDA approved for NASAL specimens only), is one component of a comprehensive MRSA colonization surveillance program. It is not intended to diagnose MRSA infection nor to guide or monitor treatment for MRSA infections. Performed at [facility], [address], [place], [place] [address]   Culture, blood (routine x 2)     Status: None (Preliminary result)   Collection Time: 11/19/19  8:14 AM   Specimen: BLOOD  Result Value Ref Range Status   Specimen Description BLOOD BLOOD RIGHT HAND  Final   Special Requests   Final    BOTTLES DRAWN AEROBIC AND ANAEROBIC  Blood Culture results may not be optimal due to an inadequate volume of blood received in culture bottles   Culture   Final    NO GROWTH 4 DAYS Performed at [facility], [address], [place], [place] [address]    Report Status PENDING  Incomplete  Culture, respiratory (non-expectorated)     Status: None   Collection Time: 11/19/19  9:47 AM   Specimen: Tracheal Aspirate; Respiratory  Result Value Ref Range Status   Specimen Description TRACHEAL ASPIRATE  Final   Special Requests NONE  Final   Gram Stain   Final    RARE WBC PRESENT, PREDOMINANTLY MONONUCLEAR NO ORGANISMS SEEN    Culture   Final    RARE Consistent with normal respiratory flora. Performed at [facility], [address] [place], [place] [address]    Report Status 11/21/2019 FINAL  Final  Culture, blood (routine x 2)     Status: None (Preliminary result)   Collection Time: 11/19/19  5:10 PM   Specimen: BLOOD  Result Value Ref Range Status   Specimen Description BLOOD BLOOD LEFT HAND  Final   Special Requests   Final    BOTTLES DRAWN AEROBIC AND ANAEROBIC Blood Culture adequate volume  Culture   Final    NO GROWTH 4 DAYS Performed at [facility], [address], [place], [place] [address]    Report Status PENDING  Incomplete  Culture, blood (routine x 2)     Status: None (Preliminary result)   Collection Time: 11/22/19 11:17 AM   Specimen: BLOOD RIGHT HAND  Result Value Ref Range Status   Specimen Description BLOOD RIGHT HAND  Final   Special Requests   Final    BOTTLES DRAWN AEROBIC ONLY Blood Culture adequate volume   Culture   Final    NO GROWTH < 24 HOURS Performed at [facility], [address] [place], [place] [address]    Report Status PENDING  Incomplete  Culture, blood (routine x 2)     Status: None (Preliminary result)   Collection Time: 11/22/19 11:20 AM   Specimen: BLOOD LEFT HAND  Result Value Ref Range Status   Specimen Description BLOOD LEFT HAND  Final    Special Requests   Final    BOTTLES DRAWN AEROBIC ONLY Blood Culture adequate volume   Culture   Final    NO GROWTH < 24 HOURS Performed at [facility], [address], [place], [place] [address]    Report Status PENDING  Incomplete    Impression/Plan:  1. Pneumonia - culture with Strep pneumonia.  Repeat cultures with no growth to date.  Will continue with penicillin.   2.  Fever - afebrile since yesterday.  I don't appreciate any new or different signs of infection and fever is improved.  I suspect it is related to her ARDS.  Will continue to monitor.  3.  ARDS - some worsening oxygen requirements.  Continue supportive care per CCM.   Will continue to follow

## 2019-12-11 NOTE — Death Summary Note (Signed)
DEATH SUMMARY   Patient Details  Name: Rachael Harvey MRN: [id number] DOB: 1981-04-08  Admission/Discharge Information   Admit Date:  11-20-19  Date of Death:    Time of Death: Time of Death: 2143-08-25  Length of Stay: 7  Referring Physician: Patient, No Pcp Per   Reason(s) for Hospitalization  39 year old female was transferred from outside hospital with acute hypoxic/hypercapnic respiratory failure, ARDS and acute kidney injury  Diagnoses  Preliminary cause of death: ARDS (adult respiratory distress syndrome) (HCC) Secondary Diagnoses (including complications and co-morbidities):  Principal Problem:   Septic shock (HCC) Active Problems:   Pressure injury of skin   ARDS (adult respiratory distress syndrome) (HCC)   AKI (acute kidney injury) (HCC)   Acute respiratory failure with hypercapnia (HCC)   On mechanically assisted ventilation (HCC)   High anion gap metabolic acidosis   Pneumococcal bacteremia   Brief Hospital Course (including significant findings, care, treatment, and services provided and events leading to death)  Rachael Harvey is a 39 y.o. year old female who [facility] for worsening respiratory failure. Patient is intubated so most of the history is taken from patient's chart, as per chart patient was not feeling well for last few days with a cough, shortness of breath and fever.  When EMS was called patient was noted to be hypoxic into the 60s, she was put on high flow and was transferred to [facility] emergency department, where she was put on BiPAP but she continued to remain hypoxic so decision was made to intubate, she was admitted in the hospital with acute hypoxic respiratory failure due to multifocal pneumonia. Over the last 2 days she has been getting IV antibiotics, but her condition continued to deteriorate with hypoxia, hypercapnia and acute metabolic acidosis associated with acute kidney injury so she was transferred to [facility] for further  management.  The patients hospital course was complicated by multiorgan failure, AHRF, ARDS from strep bacteremia. She was placed on CVVHD. She required maximal vent support. Unfortunately, continued to decline. Family was called to hospital for GOC discussion.   The decision was made for pallative, comfort care withdrawal. The patient passed here in the ICU.    Pertinent Labs and Studies  Significant Diagnostic Studies DG Chest 1 View  Result Date: 11/21/2019 CLINICAL DATA:  39 year old female with ARDS. EXAM: CHEST  1 VIEW COMPARISON:  Chest radiograph dated 11/19/2019. FINDINGS: Endotracheal tube above the carina, enteric tube extends below the diaphragm with tip beyond the inferior margin of the image, and right IJ central venous line with tip over central SVC close to the cavoatrial junction. A right-sided PICC is partially visualized with tip in the region of the right axilla in similar position. Recommend advancing of the PICC line. Diffuse bilateral interstitial densities and confluent airspace opacities similar to prior radiograph. No large pleural effusion or pneumothorax. Stable cardiomediastinal silhouette. No acute osseous pathology. IMPRESSION: 1. Stable support apparatus. 2. Bilateral pulmonary opacities with no significant interval change. Electronically Signed   By: Elgie Collard M.D.   On: 11/21/2019 03:39   DG Chest Port 1 View  Result Date: 12/02/2019 CLINICAL DATA:  39 year old female on mechanical ventilation.  ARDS. EXAM: PORTABLE CHEST 1 VIEW COMPARISON:  Chest x-ray 12/07/2019. FINDINGS: An endotracheal tube is in place with tip 5.5 cm above the carina. There is a right-sided internal jugular central venous catheter with tip terminating in the distal superior vena cava. A nasogastric tube is seen extending into the stomach, however, the tip of the  nasogastric tube extends below the lower margin of the image. Widespread patchy areas of interstitial prominence and airspace  disease scattered throughout the lungs bilaterally, with slightly worsened aeration compared to the earlier examination. Small right pleural effusion. No left pleural effusion. No pneumothorax. Pulmonary vasculature is obscured. Heart size is normal. IMPRESSION: 1. Support apparatus, as above. 2. The appearance the chest is compatible with reported clinical history of ARDS, with slightly worsened aeration, as above. Electronically Signed   By: Trudie Reed M.D.   On: 11/12/2019 17:14   DG Chest Port 1 View  Result Date: 11/22/2019 CLINICAL DATA:  Hypoxic respiratory failure. ARDS. EXAM: PORTABLE CHEST 1 VIEW COMPARISON:  11/21/2019 FINDINGS: The endotracheal tube, NG tube and right IJ catheters are stable. The cardiac silhouette, mediastinal and hilar contours are within normal limits and unchanged. Diffuse asymmetric interstitial and airspace process in the lungs. No large pleural effusion or pneumothorax. IMPRESSION: 1. Stable support apparatus. 2. Persistent diffuse interstitial and airspace process. Electronically Signed   By: Rudie Meyer M.D.   On: 12/06/2019 08:49   DG CHEST PORT 1 VIEW  Result Date: 11/21/2019 CLINICAL DATA:  Hypoxia. EXAM: PORTABLE CHEST 1 VIEW COMPARISON:  Most recent radiograph earlier this day. FINDINGS: Endotracheal tube tip remains at the thoracic inlet. Enteric tube below the diaphragm in the stomach. Tip of the right internal jugular central venous catheter unchanged in the SVC. Minimal progression in diffuse bilateral ground-glass and confluent pulmonary opacities. Small right pleural effusion. No pneumothorax. Heart is normal in size. IMPRESSION: 1. Minimal progression in diffuse bilateral ground-glass and confluent pulmonary opacities since earlier this day. Findings suggest aorta ES or multifocal pneumonia. 2. Small right pleural effusion. 3. Stable support apparatus. Electronically Signed   By: Narda Rutherford M.D.   On: 11/21/2019 21:58   DG CHEST PORT 1  VIEW  Result Date: 11/19/2019 CLINICAL DATA:  Hypoxia EXAM: PORTABLE CHEST 1 VIEW COMPARISON:  November 18, 2019 FINDINGS: Endotracheal tube tip is 4.2 cm above the carina. Nasogastric tube tip and side port are below the diaphragm. Central catheter tip is in the superior vena cava. No pneumothorax. Airspace opacity is seen throughout the lungs bilaterally, similar to 1 day prior. Heart size and pulmonary vascularity within normal limits. No adenopathy. No bone lesions. IMPRESSION: Tube and catheter positions as described without pneumothorax. Widespread airspace opacity bilaterally, most likely representing multifocal pneumonia. Heart size normal. Overall appearance similar to 1 day prior. Electronically Signed   By: Bretta Bang III M.D.   On: 11/19/2019 08:04   DG CHEST PORT 1 VIEW  Result Date: 11/18/2019 CLINICAL DATA:  Hypoxemia EXAM: PORTABLE CHEST 1 VIEW COMPARISON:  2019-11-25 FINDINGS: Endotracheal tube, nasogastric tube at right central venous line are unchanged. The mediastinal contour and cardiac silhouette are stable. Patchy consolidation are identified throughout bilateral lungs unchanged. There is no pleural effusion. There is no pneumothorax. IMPRESSION: Bilateral pneumonias unchanged. Electronically Signed   By: Sherian Rein M.D.   On: 11/18/2019 12:31   DG CHEST PORT 1 VIEW  Result Date: 25-Nov-2019 CLINICAL DATA:  Central line placement EXAM: PORTABLE CHEST 1 VIEW COMPARISON:  Nov 25, 2019 (3:02 a.m.) FINDINGS: Since the prior study there is been interval right internal jugular venous catheter placement with its distal tip noted at the junction of the superior vena cava and right atrium. A right-sided PICC line is suspected. Its distal tip is limited in visualization and is not seen beyond the right apex. Stable endotracheal tube and nasogastric  tube positioning is noted. There are stable marked severity diffuse bilateral infiltrates. No pleural effusion or pneumothorax is identified.  The heart size and mediastinal contours are within normal limits. The visualized skeletal structures are unremarkable. IMPRESSION: 1. Interval right internal jugular venous catheter placement with its distal tip noted at the junction of the superior vena cava and right atrium. 2. Probable right-sided PICC line. Electronically Signed   By: Aram Candela M.D.   On: December 07, 2019 19:48   ECHOCARDIOGRAM COMPLETE  Result Date: 11/18/2019    ECHOCARDIOGRAM REPORT   Patient Name:   ALLETA AVERY Date of Exam: 11/18/2019 Medical Rec #:  [id number]     Height:       70.0 in Accession #:    [id number]    Weight:       216.0 lb Date of Birth:  [birth year]    BSA:          2.157 m Patient Age:    38 years      BP:           109/65 mmHg Patient Gender: F             HR:           76 bpm. Exam Location:  Inpatient Procedure: 2D Echo Indications:    Bacteremia 790.7 / R78.81  History:        Patient has no prior history of Echocardiogram examinations.                 Acute hypoxic/hypercapnic respiratory failure, Septic shock with                 endorgan damage due to pneumococcal pneumonia.  Sonographer:    Leta Jungling RDCS Referring Phys: [id number] SUDHAM CHAND  Sonographer Comments: Echo performed with patient supine and on artificial respirator. IMPRESSIONS  1. Left ventricular ejection fraction, by estimation, is 55 to 60% with mild basal inferior hypokinesis. The left ventricle has normal function.  2. Right ventricular systolic function is normal. The right ventricular size is normal. There is normal pulmonary artery systolic pressure.  3. The mitral valve is abnormal. Trivial mitral valve regurgitation.  4. The aortic valve is normal in structure. Aortic valve regurgitation is not visualized.  5. The inferior vena cava is normal in size with greater than 50% respiratory variability, suggesting right atrial pressure of 3 mmHg. FINDINGS  Left Ventricle: Left ventricular ejection fraction, by estimation, is 55 to 60% with  mild basal hypokinesis . The left ventricle has normal function. The left ventricle demonstrates regional wall motion abnormalities. The left ventricular internal cavity size was normal in size. There is no left ventricular hypertrophy. Left ventricular diastolic parameters were normal. Right Ventricle: The right ventricular size is normal. Right vetricular wall thickness was not assessed. Right ventricular systolic function is normal. There is normal pulmonary artery systolic pressure. The tricuspid regurgitant velocity is 2.44 m/s, and with an assumed right atrial pressure of 8 mmHg, the estimated right ventricular systolic pressure is 31.8 mmHg. Left Atrium: Left atrial size was normal in size. Right Atrium: Right atrial size was normal in size. Pericardium: There is no evidence of pericardial effusion. Mitral Valve: The mitral valve is abnormal. There is mild thickening of the mitral valve leaflet(s). Trivial mitral valve regurgitation. Tricuspid Valve: The tricuspid valve is normal in structure. Tricuspid valve regurgitation is mild. Aortic Valve: The aortic valve is normal in structure. Aortic valve regurgitation is not visualized. Pulmonic Valve: The pulmonic valve  was grossly normal. Pulmonic valve regurgitation is not visualized. Aorta: The aortic root is normal in size and structure. Venous: The inferior vena cava is normal in size with greater than 50% respiratory variability, suggesting right atrial pressure of 3 mmHg. IAS/Shunts: No atrial level shunt detected by color flow Doppler.  LEFT VENTRICLE PLAX 2D LVIDd:         4.40 cm  Diastology LVIDs:         3.00 cm  LV e' lateral:   11.70 cm/s LV PW:         0.80 cm  LV E/e' lateral: 9.2 LV IVS:        0.90 cm  LV e' medial:    11.40 cm/s LVOT diam:     1.90 cm  LV E/e' medial:  9.5 LV SV:         65 LV SV Index:   30 LVOT Area:     2.84 cm  RIGHT VENTRICLE TAPSE (M-mode): 2.4 cm LEFT ATRIUM             Index       RIGHT ATRIUM           Index LA diam:         3.30 cm 1.53 cm/m  RA Area:     13.70 cm LA Vol (A2C):   54.1 ml 25.06 ml/m RA Volume:   34.40 ml  15.95 ml/m LA Vol (A4C):   47.6 ml 22.07 ml/m LA Biplane Vol: 47.9 ml 22.21 ml/m  AORTIC VALVE LVOT Vmax:   131.00 cm/s LVOT Vmean:  80.300 cm/s LVOT VTI:    0.230 m  AORTA Ao Root diam: 2.80 cm MITRAL VALVE                TRICUSPID VALVE MV Area (PHT): 4.29 cm     TR Peak grad:   23.8 mmHg MV Decel Time: 177 msec     TR Vmax:        244.00 cm/s MV E velocity: 108.00 cm/s MV A velocity: 53.10 cm/s   SHUNTS MV E/A ratio:  2.03         Systemic VTI:  0.23 m                             Systemic Diam: 1.90 cm Dietrich PatesPaula Ross MD Electronically signed by Dietrich PatesPaula Ross MD Signature Date/Time: 11/18/2019/6:30:51 PM    Final     Microbiology Recent Results (from the past 240 hour(s))  MRSA PCR Screening     Status: None   Collection Time: 08-29-19  7:51 PM   Specimen: Nasal Mucosa; Nasopharyngeal  Result Value Ref Range Status   MRSA by PCR NEGATIVE NEGATIVE Final    Comment:        The GeneXpert MRSA Assay (FDA approved for NASAL specimens only), is one component of a comprehensive MRSA colonization surveillance program. It is not intended to diagnose MRSA infection nor to guide or monitor treatment for MRSA infections. Performed at [facility], [address] [place], [place] [id number]   Culture, blood (routine x 2)     Status: None (Preliminary result)   Collection Time: 11/19/19  8:14 AM   Specimen: BLOOD  Result Value Ref Range Status   Specimen Description BLOOD BLOOD RIGHT HAND  Final   Special Requests   Final    BOTTLES DRAWN AEROBIC AND ANAEROBIC Blood Culture results may not be  optimal due to an inadequate volume of blood received in culture bottles   Culture   Final    NO GROWTH 4 DAYS Performed at [facility], [address], [place], [place] [address]    Report Status PENDING  Incomplete  Culture, respiratory (non-expectorated)     Status: None   Collection Time:  11/19/19  9:47 AM   Specimen: Tracheal Aspirate; Respiratory  Result Value Ref Range Status   Specimen Description TRACHEAL ASPIRATE  Final   Special Requests NONE  Final   Gram Stain   Final    RARE WBC PRESENT, PREDOMINANTLY MONONUCLEAR NO ORGANISMS SEEN    Culture   Final    RARE Consistent with normal respiratory flora. Performed at [facility], [address] [place], [place] [address]    Report Status 11/21/2019 FINAL  Final  Culture, blood (routine x 2)     Status: None (Preliminary result)   Collection Time: 11/19/19  5:10 PM   Specimen: BLOOD  Result Value Ref Range Status   Specimen Description BLOOD BLOOD LEFT HAND  Final   Special Requests   Final    BOTTLES DRAWN AEROBIC AND ANAEROBIC Blood Culture adequate volume   Culture   Final    NO GROWTH 4 DAYS Performed at [facility], [address], [place], [place] [address]    Report Status PENDING  Incomplete  Culture, blood (routine x 2)     Status: None (Preliminary result)   Collection Time: 11/22/19 11:17 AM   Specimen: BLOOD RIGHT HAND  Result Value Ref Range Status   Specimen Description BLOOD RIGHT HAND  Final   Special Requests   Final    BOTTLES DRAWN AEROBIC ONLY Blood Culture adequate volume   Culture   Final    NO GROWTH < 24 HOURS Performed at [facility], [address], [place], [place] [address]    Report Status PENDING  Incomplete  Culture, blood (routine x 2)     Status: None (Preliminary result)   Collection Time: 11/22/19 11:20 AM   Specimen: BLOOD LEFT HAND  Result Value Ref Range Status   Specimen Description BLOOD LEFT HAND  Final   Special Requests   Final    BOTTLES DRAWN AEROBIC ONLY Blood Culture adequate volume   Culture   Final    NO GROWTH < 24 HOURS Performed at [facility], [address], [place], [place] [address]    Report Status PENDING  Incomplete    Lab Basic Metabolic Panel: Recent Labs  Lab 11/19/19 0425 11/19/19 0821 11/20/19 0358  11/20/19 1015 11/21/19 0450 11/21/19 1320 11/21/19 1625 11/21/19 1625 11/22/19 0315 11/22/19 0317 11/22/19 1625 19-Dec-2019 0300 12/19/19 0837 19-Dec-2019 1608 19-Dec-2019 1916  NA 141   < > 139   < > 139   < > 139   < > 138   < > 136 135 136 133* 133*  K 2.8*   < > 4.0   < > 4.1   < > 4.4   < > 4.9   < > 4.8 5.2* 5.1 5.9* 6.1*  CL 104   < > 105   < > 104  --  106  --  105  --  103 104  --  100  --   CO2 25   < > 24   < > 26  --  26  --  25  --  25 22  --  25  --   GLUCOSE 125*   < > 151*   < > 130*  --  137*  --  143*  --  131* 157*  --  179*  --   BUN 87*   < > 51*   < > 36*  --  34*  --  34*  --  35* 35*  --  36*  --   CREATININE 2.14*   < > 1.15*   < > 0.85  --  0.87  --  0.92  --  1.06* 1.09*  --  1.28*  --   CALCIUM 8.0*   < > 8.3*   < > 8.6*  --  8.4*  --  8.7*  --  8.5* 8.6*  --  8.8*  --   MG 2.2  --  2.5*  --  2.7*  --   --   --  2.5*  --   --  2.8*  --   --   --   PHOS 3.8   < > 2.6   < > 3.3  --  2.9  --  3.8  --  3.6 4.3  --  7.0*  --    < > = values in this interval not displayed.   Liver Function Tests: Recent Labs  Lab 11/21/19 1625 11/22/19 0315 11/22/19 1625 11/22/2019 0300 11/13/2019 1608  ALBUMIN 2.4* 2.5* 2.3* 2.2* 2.1*   No results for input(s): LIPASE, AMYLASE in the last 168 hours. No results for input(s): AMMONIA in the last 168 hours. CBC: Recent Labs  Lab 11/19/19 0425 11/19/19 0821 11/20/19 0358 11/20/19 1015 11/21/19 0450 11/21/19 1320 11/22/19 0315 11/22/19 0315 11/22/19 0317 11/22/19 0828 12/04/2019 0300 11/27/2019 0837 12/01/2019 1916  WBC 10.1  --  14.4*  --  14.1*  --  18.0*  --   --   --  18.7*  --   --   HGB 11.7*   < > 10.6*   < > 9.1*   < > 9.2*   < > 9.9* 9.5* 9.3* 9.9* 10.5*  HCT 35.0*   < > 32.9*   < > 29.6*   < > 30.2*   < > 29.0* 28.0* 30.7* 29.0* 31.0*  MCV 87.3  --  88.4  --  91.6  --  94.4  --   --   --  95.9  --   --   PLT 49*  --  67*  --  89*  --  112*  --   --   --  134*  --   --    < > = values in this interval not displayed.    Cardiac Enzymes: No results for input(s): CKTOTAL, CKMB, CKMBINDEX, TROPONINI in the last 168 hours. Sepsis Labs: Recent Labs  Lab 11/20/19 0358 11/21/19 0450 11/22/19 0315 11/18/2019 0300  WBC 14.4* 14.1* 18.0* 18.7*    Procedures/Operations  CVC Aline HD cath ETT   Olon Russ L Tishia Maestre 11/24/2019, 9:22 AM

## 2019-12-11 NOTE — Progress Notes (Signed)
Pt placed into the prone position per MD order. Pt proned with RT x2, RN x5, NT x1 and CCM MD Resident x1. Pt placed into the prone position without difficulty. Pt tolerated well and ETT remains secure at 23cm at the left center lip. Left arm placed up and pts head turned to the left. Right arm to remain down due to axillary arterial line. RT will continue to monitor.

## 2019-12-11 NOTE — Progress Notes (Signed)
PCCM:  I called and spoke with patients sister. Her mother is unable to come to the phone for some reason per sister.   I gave them an update on her poor prognosis and progressive decline.   I have requested they come to the hospital to visit and have a goals of care discussion.   Josephine Igo, DO  Pulmonary Critical Care 11/19/2019 3:58 PM

## 2019-12-11 NOTE — Progress Notes (Signed)
Pt returned to supine position for desaturations despite vent changes/ recruitment maneuvers. Pt placed to supine position with RT x1, RN x3, CCM MD x1 and CCM MD Residents x2. Pt tolerated well. RT will continue to monitor.

## 2019-12-11 NOTE — Progress Notes (Signed)
eLink Physician-Brief Progress Note Patient Name: Rachael Harvey DOB: 09-30-1980 MRN: [id number]   Date of Service  12-01-2019  HPI/Events of Note  ABG on 100%/VC 32/TV 400/P 14 = 7.13/82.6/202/27.5.  eICU Interventions  Plan: 1. NaHCO3 100 neq IV now. 2. NaHCO3 IV infusion to run IV at 50 mL/hour. 3. Repeat ABG at 10 PM.      Intervention Category Major Interventions: Acid-Base disturbance - evaluation and management;Respiratory failure - evaluation and management  Lenell Antu 01-Dec-2019, 7:30 PM

## 2019-12-11 NOTE — Progress Notes (Signed)
Pt Rachael Harvey ETT holder removed and protective barriers placed to pts cheek and upper lip areas. Pts ETT then resecured with cloth tape at 23 cm at the left center lip.

## 2019-12-11 DEATH — deceased

## 2021-01-28 IMAGING — DX DG CHEST 1V PORT
1 series · 1 of 1 positions shown · non-contrast
Comparison: [DATE] [DATE], [DATE] ([DATE] a.m.)

CLINICAL DATA: Central line placement

EXAM:
PORTABLE CHEST 1 VIEW

[chest]
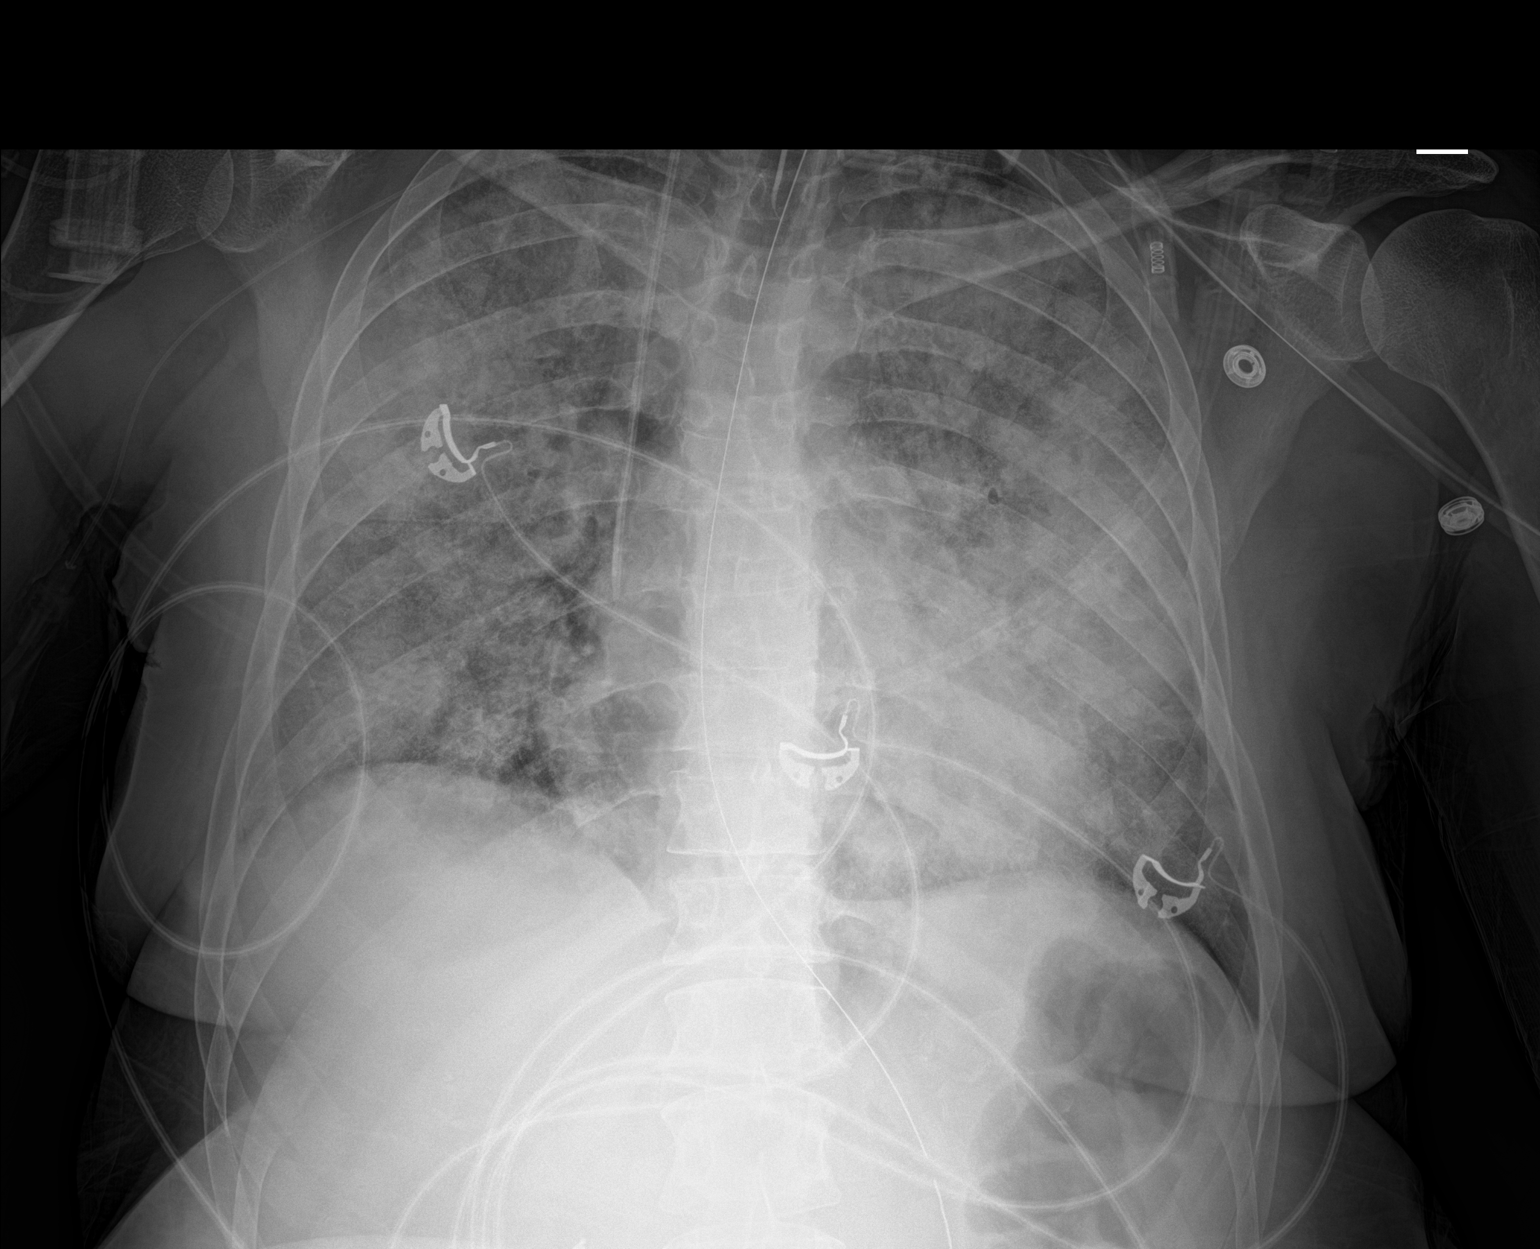

[1 of 1 positions shown; findings below may reference images not displayed]

FINDINGS: Since the prior study there is been interval right internal jugular
venous catheter placement with its distal tip noted at the junction
of the superior vena cava and right atrium. A right-sided PICC line
is suspected. Its distal tip is limited in visualization and is not
seen beyond the right apex. Stable endotracheal tube and nasogastric
tube positioning is noted. There are stable marked severity diffuse
bilateral infiltrates. No pleural effusion or pneumothorax is
identified. The heart size and mediastinal contours are within
normal limits. The visualized skeletal structures are unremarkable.
IMPRESSION: 1. Interval right internal jugular venous catheter placement with
its distal tip noted at the junction of the superior vena cava and
right atrium.
2. Probable right-sided PICC line.

## 2021-02-04 IMAGING — DX DG CHEST 1V PORT
1 series · 2 of 2 positions shown · non-contrast
Comparison: Chest x-ray 11/23/2019.

CLINICAL DATA: 38-year-old female on mechanical ventilation.  ARDS.

EXAM:
PORTABLE CHEST 1 VIEW

[Series 1: chest · 0.14mm/px · 2 of 2 slices shown]
[im 1/2]
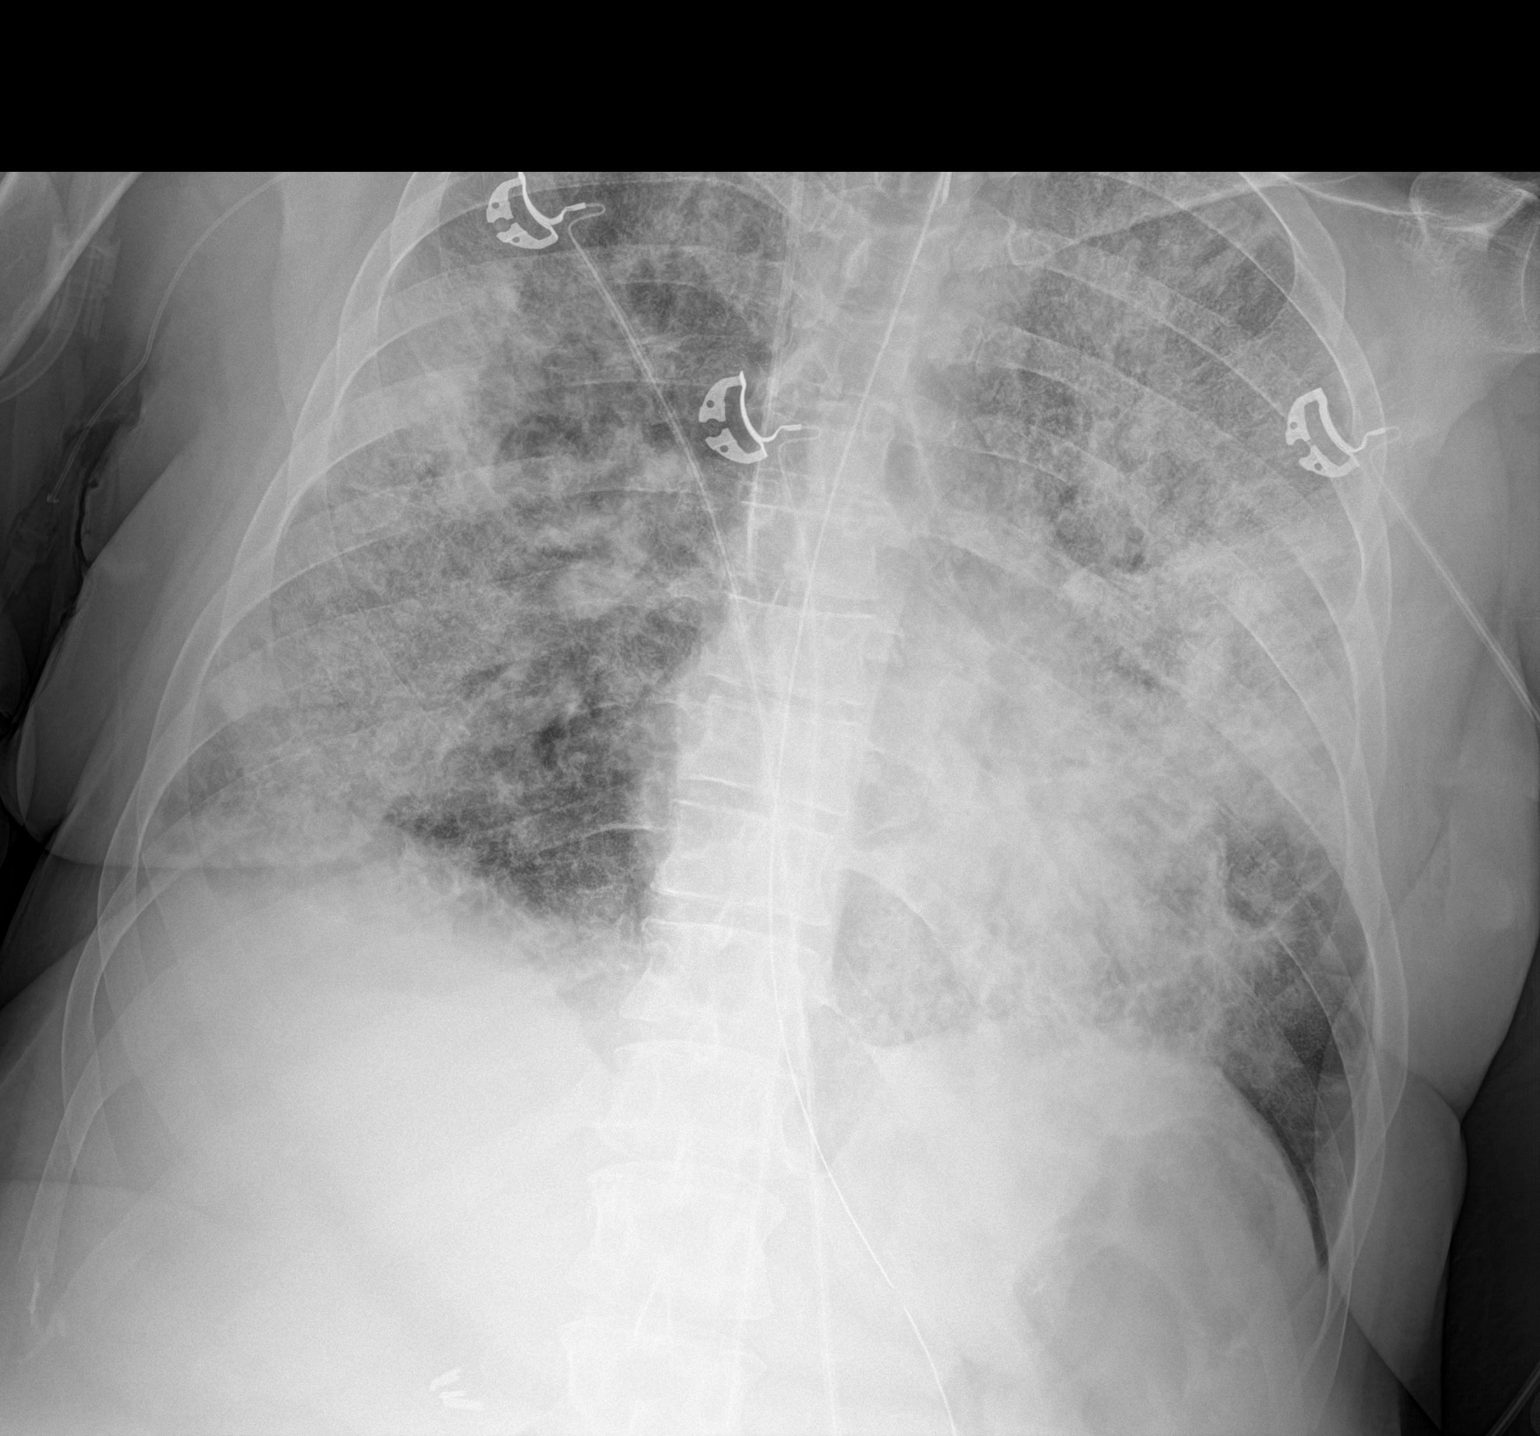
[im 2/2]
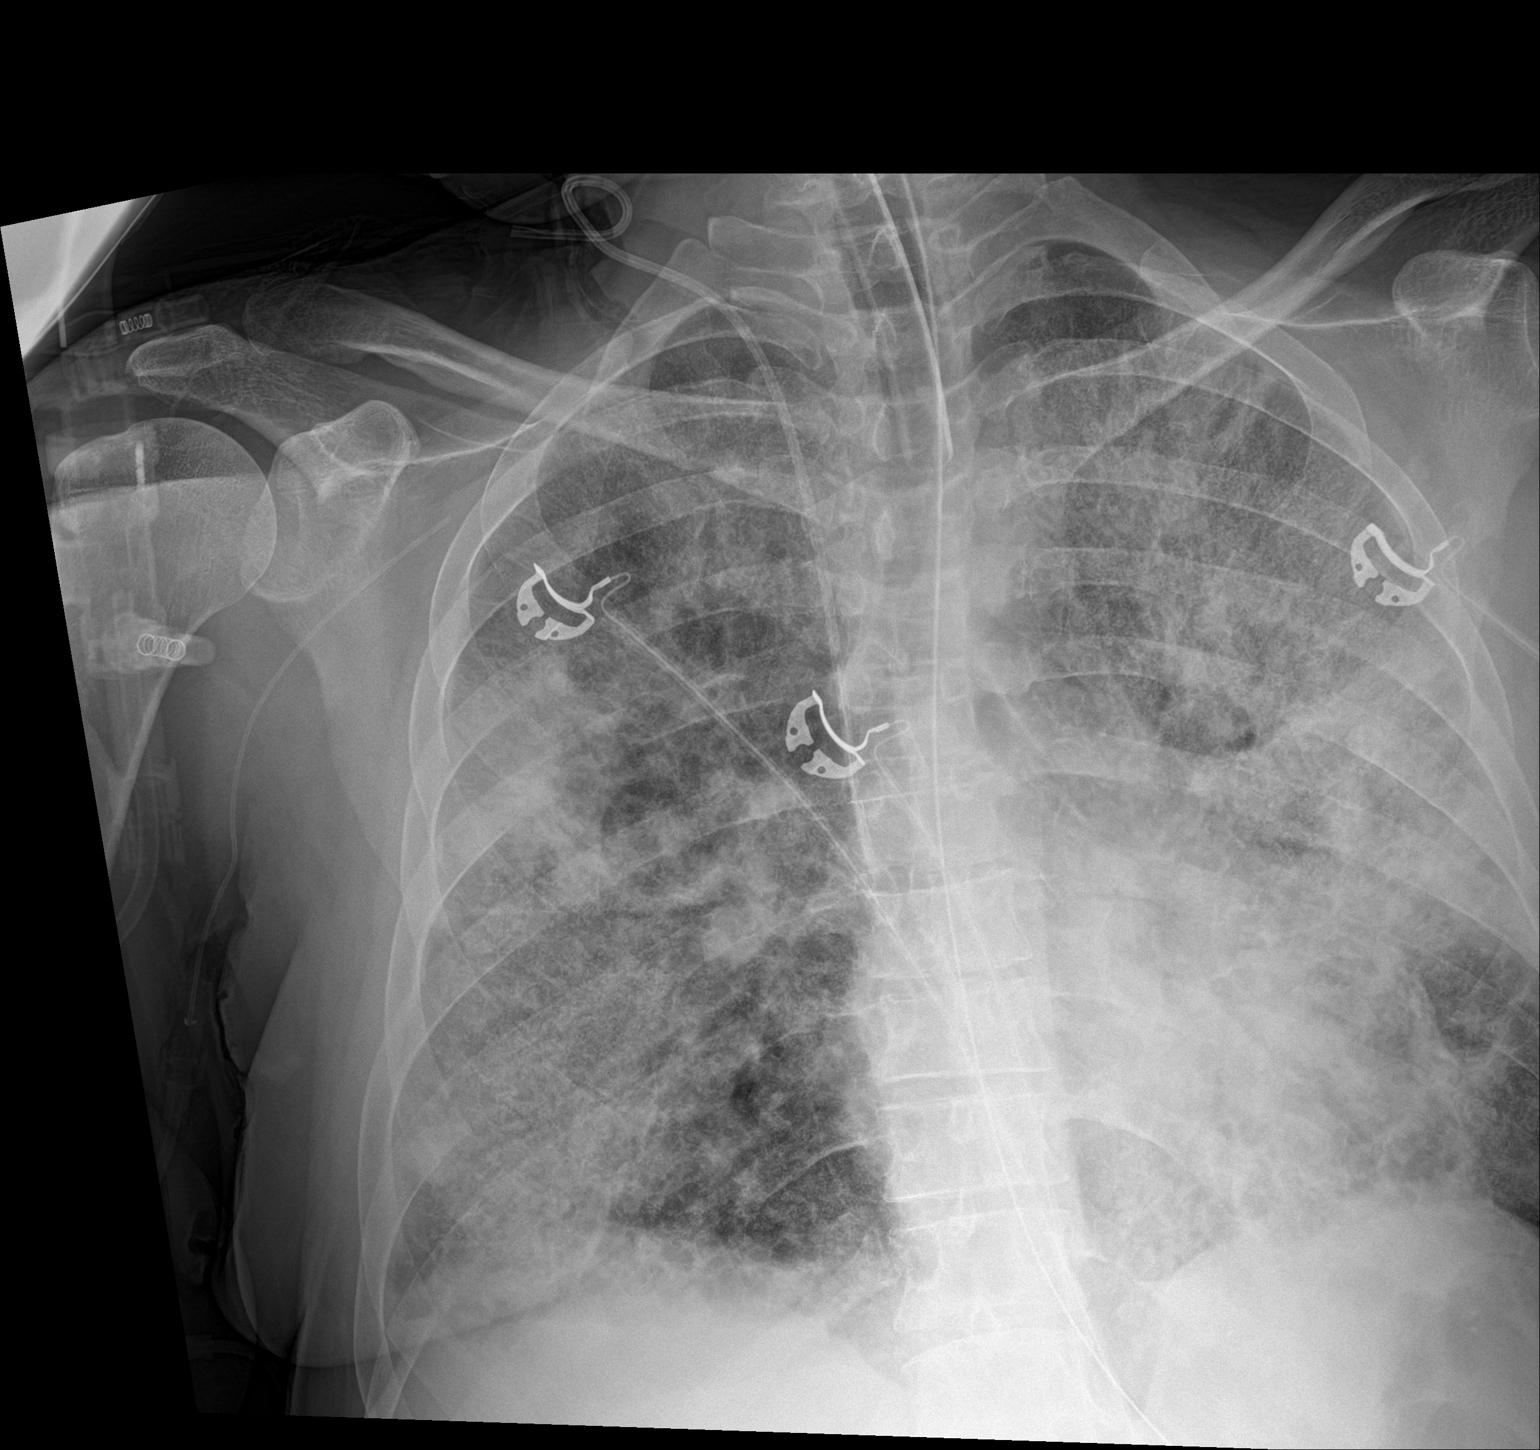

[2 of 2 positions shown; findings below may reference images not displayed]

FINDINGS: An endotracheal tube is in place with tip 5.5 cm above the carina.
There is a right-sided internal jugular central venous catheter with
tip terminating in the distal superior vena cava. A nasogastric tube
is seen extending into the stomach, however, the tip of the
nasogastric tube extends below the lower margin of the image.
Widespread patchy areas of interstitial prominence and airspace
disease scattered throughout the lungs bilaterally, with slightly
worsened aeration compared to the earlier examination. Small right
pleural effusion. No left pleural effusion. No pneumothorax.
Pulmonary vasculature is obscured. Heart size is normal.
IMPRESSION: 1. Support apparatus, as above.
2. The appearance the chest is compatible with reported clinical
history of ARDS, with slightly worsened aeration, as above.
